# Patient Record
Sex: Female | Born: 1970
Health system: Southern US, Community
[De-identification: ages and names within clinical notes are randomized; demographics above are authoritative.]

## PROBLEM LIST (undated history)

## (undated) DIAGNOSIS — F419 Anxiety disorder, unspecified: Secondary | ICD-10-CM

## (undated) DIAGNOSIS — I471 Supraventricular tachycardia, unspecified: Secondary | ICD-10-CM

## (undated) DIAGNOSIS — K5909 Other constipation: Secondary | ICD-10-CM

## (undated) DIAGNOSIS — T7840XA Allergy, unspecified, initial encounter: Secondary | ICD-10-CM

## (undated) DIAGNOSIS — I499 Cardiac arrhythmia, unspecified: Secondary | ICD-10-CM

## (undated) DIAGNOSIS — J302 Other seasonal allergic rhinitis: Secondary | ICD-10-CM

## (undated) HISTORY — DX: Supraventricular tachycardia, unspecified: I47.10

## (undated) HISTORY — DX: Other constipation: K59.09

## (undated) HISTORY — DX: Anxiety disorder, unspecified: F41.9

## (undated) HISTORY — DX: Other seasonal allergic rhinitis: J30.2

## (undated) HISTORY — DX: Allergy, unspecified, initial encounter: T78.40XA

## (undated) HISTORY — PX: POLYPECTOMY: SHX149

## (undated) HISTORY — PX: COLONOSCOPY: SHX174

---

## 1998-06-16 ENCOUNTER — Other Ambulatory Visit: Admission: RE | Admit: 1998-06-16 | Discharge: 1998-06-16 | Payer: Self-pay | Admitting: *Deleted

## 2001-03-07 ENCOUNTER — Other Ambulatory Visit: Admission: RE | Admit: 2001-03-07 | Discharge: 2001-03-07 | Payer: Self-pay | Admitting: Obstetrics and Gynecology

## 2002-01-04 ENCOUNTER — Inpatient Hospital Stay (HOSPITAL_COMMUNITY): Admission: AD | Admit: 2002-01-04 | Discharge: 2002-01-06 | Payer: Self-pay | Admitting: Obstetrics and Gynecology

## 2002-02-12 ENCOUNTER — Other Ambulatory Visit: Admission: RE | Admit: 2002-02-12 | Discharge: 2002-02-12 | Payer: Self-pay | Admitting: Obstetrics and Gynecology

## 2003-03-06 ENCOUNTER — Other Ambulatory Visit: Admission: RE | Admit: 2003-03-06 | Discharge: 2003-03-06 | Payer: Self-pay | Admitting: Obstetrics and Gynecology

## 2004-03-13 ENCOUNTER — Other Ambulatory Visit: Admission: RE | Admit: 2004-03-13 | Discharge: 2004-03-13 | Payer: Self-pay | Admitting: Obstetrics and Gynecology

## 2004-05-27 ENCOUNTER — Ambulatory Visit: Payer: Self-pay | Admitting: Internal Medicine

## 2004-07-14 ENCOUNTER — Ambulatory Visit: Payer: Self-pay | Admitting: Family Medicine

## 2004-09-15 ENCOUNTER — Other Ambulatory Visit: Admission: RE | Admit: 2004-09-15 | Discharge: 2004-09-15 | Payer: Self-pay | Admitting: Obstetrics and Gynecology

## 2004-11-11 ENCOUNTER — Ambulatory Visit (HOSPITAL_COMMUNITY): Admission: RE | Admit: 2004-11-11 | Discharge: 2004-11-11 | Payer: Self-pay | Admitting: Obstetrics and Gynecology

## 2004-11-27 ENCOUNTER — Ambulatory Visit: Payer: Self-pay | Admitting: Cardiovascular Disease

## 2004-12-11 ENCOUNTER — Ambulatory Visit: Payer: Self-pay

## 2004-12-11 ENCOUNTER — Ambulatory Visit: Payer: Self-pay | Admitting: Cardiovascular Disease

## 2005-03-19 ENCOUNTER — Inpatient Hospital Stay (HOSPITAL_COMMUNITY): Admission: AD | Admit: 2005-03-19 | Discharge: 2005-03-19 | Payer: Self-pay | Admitting: Obstetrics and Gynecology

## 2005-03-25 ENCOUNTER — Inpatient Hospital Stay (HOSPITAL_COMMUNITY): Admission: AD | Admit: 2005-03-25 | Discharge: 2005-03-26 | Payer: Self-pay | Admitting: Obstetrics and Gynecology

## 2005-04-28 ENCOUNTER — Other Ambulatory Visit: Admission: RE | Admit: 2005-04-28 | Discharge: 2005-04-28 | Payer: Self-pay | Admitting: Obstetrics and Gynecology

## 2006-10-14 ENCOUNTER — Encounter: Admission: RE | Admit: 2006-10-14 | Discharge: 2006-10-14 | Payer: Self-pay | Admitting: Obstetrics and Gynecology

## 2006-10-18 ENCOUNTER — Encounter: Admission: RE | Admit: 2006-10-18 | Discharge: 2006-10-18 | Payer: Self-pay | Admitting: Obstetrics and Gynecology

## 2011-06-29 ENCOUNTER — Other Ambulatory Visit: Payer: Self-pay | Admitting: Obstetrics and Gynecology

## 2011-06-29 DIAGNOSIS — Z1231 Encounter for screening mammogram for malignant neoplasm of breast: Secondary | ICD-10-CM

## 2011-08-13 ENCOUNTER — Ambulatory Visit
Admission: RE | Admit: 2011-08-13 | Discharge: 2011-08-13 | Disposition: A | Discharge status: Home / Self Care | Payer: BC Managed Care – PPO | Source: Ambulatory Visit | Attending: Obstetrics and Gynecology | Admitting: Obstetrics and Gynecology

## 2011-08-13 DIAGNOSIS — Z1231 Encounter for screening mammogram for malignant neoplasm of breast: Secondary | ICD-10-CM

## 2011-08-19 ENCOUNTER — Other Ambulatory Visit: Payer: Self-pay | Admitting: Obstetrics and Gynecology

## 2011-08-19 DIAGNOSIS — R928 Other abnormal and inconclusive findings on diagnostic imaging of breast: Secondary | ICD-10-CM

## 2011-08-27 ENCOUNTER — Ambulatory Visit
Admission: RE | Admit: 2011-08-27 | Discharge: 2011-08-27 | Disposition: A | Discharge status: Home / Self Care | Payer: BC Managed Care – PPO | Source: Ambulatory Visit | Attending: Obstetrics and Gynecology | Admitting: Obstetrics and Gynecology

## 2011-08-27 DIAGNOSIS — R928 Other abnormal and inconclusive findings on diagnostic imaging of breast: Secondary | ICD-10-CM

## 2012-04-10 ENCOUNTER — Encounter: Payer: Self-pay | Admitting: Gastroenterology

## 2012-04-10 NOTE — Telephone Encounter (Signed)
Error

## 2012-05-05 ENCOUNTER — Encounter: Payer: Self-pay | Admitting: Gastroenterology

## 2012-05-05 ENCOUNTER — Ambulatory Visit (INDEPENDENT_AMBULATORY_CARE_PROVIDER_SITE_OTHER): Payer: BC Managed Care – PPO | Admitting: Gastroenterology

## 2012-05-05 VITALS — BP 98/60 | HR 72 | Ht 72.0 in | Wt 151.0 lb

## 2012-05-05 DIAGNOSIS — R198 Other specified symptoms and signs involving the digestive system and abdomen: Secondary | ICD-10-CM

## 2012-05-05 DIAGNOSIS — K5900 Constipation, unspecified: Secondary | ICD-10-CM

## 2012-05-05 DIAGNOSIS — R143 Flatulence: Secondary | ICD-10-CM

## 2012-05-05 DIAGNOSIS — R140 Abdominal distension (gaseous): Secondary | ICD-10-CM

## 2012-05-05 MED ORDER — ALIGN 4 MG PO CAPS: 1.0000 | ORAL_CAPSULE | Freq: Every day | ORAL | Status: DC

## 2012-05-05 MED ORDER — PEG-KCL-NACL-NASULF-NA ASC-C 100 G PO SOLR: 1.0000 | Freq: Once | ORAL | Status: DC

## 2012-05-05 NOTE — Patient Instructions (Addendum)
You have been scheduled for a colonoscopy with propofol. Please follow written instructions given to you at your visit today.  Please pick up your prep kit at the pharmacy within the next 1-3 days. If you use inhalers (even only as needed) or a CPAP machine, please bring them with you on the day of your procedure.  We have given you samples of Align. This puts good bacteria back into your colon. You should take 1 capsule by mouth once daily. If this works well for you, it can be purchased over the counter.  Please have Dr. Ferd Hibbs office fax your blood work and Hemoccult cards attention Marchelle Folks to [phone]. Thank you.  cc: Creola Corn, MD

## 2012-05-05 NOTE — Progress Notes (Signed)
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 I evaluated her in 8 and in 1998 with similar symptoms. She underwent flexible sigmoidoscopy in 1998 that was unremarkable except for a suboptimal bowel preparation. She was treated with laxatives at the time. She recently started following a FODMAP diet and taking MiraLax regularly and her symptoms have improved. She has blood work and stool Hemoccults pending from Dr. Ferd Hibbs office for an upcoming physical exam. She states both of her parents have had colon polyps. No family history of colon cancer or IBD. Denies weight loss, abdominal pain, diarrhea, change in stool caliber, melena, hematochezia, nausea, vomiting, dysphagia, reflux symptoms, chest pain.  Review of Systems: Pertinent positive and negative review of systems were noted in the above HPI section. All other review of systems were otherwise negative.  Current Medications, Allergies, Past Medical History, Past Surgical History, Family History and Social History were reviewed in [employer] record.  Physical Exam: General: Well developed , well nourished, no acute distress Head: Normocephalic and atraumatic Eyes:  sclerae anicteric, EOMI Ears: Normal auditory acuity Mouth: No deformity or lesions Neck: Supple, no masses or thyromegaly Lungs: Clear throughout to auscultation Heart: Regular rate and rhythm; no murmurs, rubs or bruits Abdomen: Soft, mild diffuse tenderness to deep palpation without rebound or guarding and non distended. No masses, hepatosplenomegaly or hernias noted. Normal Bowel sounds Rectal: Deferred to colonoscopy  Musculoskeletal: Symmetrical with no gross deformities  Skin: No lesions on visible extremities Pulses:  Normal pulses noted Extremities: No clubbing, cyanosis, edema  or deformities noted Neurological: Alert oriented x 4, grossly nonfocal Cervical Nodes:  No significant cervical adenopathy Inguinal Nodes: No significant inguinal adenopathy Psychological:  Alert and cooperative. Normal mood and affect  Assessment and Recommendations:  1. Change in bowel habits, constipation, gas and bloating. Await blood work and stool Hemoccults from Dr. Ferd Hibbs office. Continue MiraLax once or twice daily titrated for adequate bowel movements. Trial of a daily probiotic. Continue FODMAP diet for now but her symptoms may improve with adequate treatment of constipation. Rule out colorectal lesions. Schedule colonoscopy. The risks, benefits, and alternatives to colonoscopy with possible biopsy and possible polypectomy were discussed with the patient and they consent to proceed.

## 2012-05-08 ENCOUNTER — Encounter: Payer: Self-pay | Admitting: Gastroenterology

## 2012-05-18 ENCOUNTER — Ambulatory Visit (AMBULATORY_SURGERY_CENTER): Payer: BC Managed Care – PPO | Admitting: Gastroenterology

## 2012-05-18 ENCOUNTER — Encounter: Payer: Self-pay | Admitting: Gastroenterology

## 2012-05-18 VITALS — BP 100/65 | HR 58 | Temp 98.5°F | Resp 27 | Ht 72.0 in | Wt 151.0 lb

## 2012-05-18 DIAGNOSIS — D126 Benign neoplasm of colon, unspecified: Secondary | ICD-10-CM

## 2012-05-18 DIAGNOSIS — R141 Gas pain: Secondary | ICD-10-CM

## 2012-05-18 DIAGNOSIS — R142 Eructation: Secondary | ICD-10-CM

## 2012-05-18 DIAGNOSIS — R143 Flatulence: Secondary | ICD-10-CM

## 2012-05-18 DIAGNOSIS — K5900 Constipation, unspecified: Secondary | ICD-10-CM

## 2012-05-18 MED ORDER — SODIUM CHLORIDE 0.9 % IV SOLN: 500.0000 mL | INTRAVENOUS | Status: DC

## 2012-05-18 NOTE — Progress Notes (Signed)
Patient did not experience any of the following events: a burn prior to discharge; a fall within the facility; wrong site/side/patient/procedure/implant event; or a hospital transfer or hospital admission upon discharge from the facility. (G8907) Patient did not have preoperative order for IV antibiotic SSI prophylaxis. (G8918)  

## 2012-05-18 NOTE — Op Note (Signed)
[facility] [address] [place], [address]   COLONOSCOPY PROCEDURE REPORT  PATIENT: Monica Oconnell, Monica Oconnell  MR#: [id number] BIRTHDATE: March 04, 1971 , 41  yrs. old GENDER: Female ENDOSCOPIST: Meryl Dare, MD, [facility] PROCEDURE DATE:  05/18/2012 PROCEDURE:   Colonoscopy with snare polypectomy ASA CLASS:   Class II INDICATIONS:constipation, change in bowel habits, and abdominal pain. MEDICATIONS: MAC sedation, administered by CRNA and propofol (Diprivan) 400mg  IV DESCRIPTION OF PROCEDURE:   After the risks benefits and alternatives of the procedure were thoroughly explained, informed consent was obtained.  A digital rectal exam revealed no abnormalities of the rectum.   The LB [id number]-[id number]  endoscope was introduced through the anus and advanced to the cecum, which was identified by both the appendix and ileocecal valve. No adverse events experienced.   The quality of the prep was good, using MoviPrep  The instrument was then slowly withdrawn as the colon was fully examined.  COLON FINDINGS: A sessile polyp measuring 5 mm in size was found at the cecum.  A polypectomy was performed with a cold snare.  The resection was complete and the polyp tissue was completely retrieved.   The colon was otherwise normal.  There was no diverticulosis, inflammation, polyps or cancers unless previously stated.  Retroflexed views revealed no abnormalities. The time to cecum=3 minutes 06 seconds.  Withdrawal time=13 minutes 39 seconds. The scope was withdrawn and the procedure completed.  COMPLICATIONS: There were no complications.  ENDOSCOPIC IMPRESSION: 1.   Sessile polyp at the cecum; polypectomy performed with a cold snare 2.   The colon was otherwise normal  RECOMMENDATIONS: 1.  Await pathology results 2.  Repeat colonoscopy in 5 years if polyp adenomatous; otherwise 10 years 3.  Follow recommendations from recent office appt   eSigned:  Meryl Dare, MD, [facility]  05/18/2012 9:28 AM

## 2012-05-18 NOTE — Patient Instructions (Addendum)
Discharge instructions given with verbal understanding. Handout on polyp given. Resume previous medications. YOU HAD AN ENDOSCOPIC PROCEDURE TODAY AT THE [facility]: Refer to the procedure report that was given to you for any specific questions about what was found during the examination.  If the procedure report does not answer your questions, please call your gastroenterologist to clarify.  If you requested that your care partner not be given the details of your procedure findings, then the procedure report has been included in a sealed envelope for you to review at your convenience later.  YOU SHOULD EXPECT: Some feelings of bloating in the abdomen. Passage of more gas than usual.  Walking can help get rid of the air that was put into your GI tract during the procedure and reduce the bloating. If you had a lower endoscopy (such as a colonoscopy or flexible sigmoidoscopy) you may notice spotting of blood in your stool or on the toilet paper. If you underwent a bowel prep for your procedure, then you may not have a normal bowel movement for a few days.  DIET: Your first meal following the procedure should be a light meal and then it is ok to progress to your normal diet.  A half-sandwich or bowl of soup is an example of a good first meal.  Heavy or fried foods are harder to digest and may make you feel nauseous or bloated.  Likewise meals heavy in dairy and vegetables can cause extra gas to form and this can also increase the bloating.  Drink plenty of fluids but you should avoid alcoholic beverages for 24 hours.  ACTIVITY: Your care partner should take you home directly after the procedure.  You should plan to take it easy, moving slowly for the rest of the day.  You can resume normal activity the day after the procedure however you should NOT DRIVE or use heavy machinery for 24 hours (because of the sedation medicines used during the test).    SYMPTOMS TO REPORT IMMEDIATELY: A  gastroenterologist can be reached at any hour.  During normal business hours, 8:30 AM to 5:00 PM Monday through Friday, call [phone].  After hours and on weekends, please call the GI answering service at [phone] who will take a message and have the physician on call contact you.   Following lower endoscopy (colonoscopy or flexible sigmoidoscopy):  Excessive amounts of blood in the stool  Significant tenderness or worsening of abdominal pains  Swelling of the abdomen that is new, acute  Fever of 100F or higher   FOLLOW UP: If any biopsies were taken you will be contacted by phone or by letter within the next 1-3 weeks.  Call your gastroenterologist if you have not heard about the biopsies in 3 weeks.  Our staff will call the home number listed on your records the next business day following your procedure to check on you and address any questions or concerns that you may have at that time regarding the information given to you following your procedure. This is a courtesy call and so if there is no answer at the home number and we have not heard from you through the emergency physician on call, we will assume that you have returned to your regular daily activities without incident.  SIGNATURES/CONFIDENTIALITY: You and/or your care partner have signed paperwork which will be entered into your electronic medical record.  These signatures attest to the fact that that the information above on your After Visit Summary   has been reviewed and is understood.  Full responsibility of the confidentiality of this discharge information lies with you and/or your care-partner.  

## 2012-05-18 NOTE — Progress Notes (Signed)
Propofol per [facility] CR[facility], see scanned intra procedure report. All meds titrated per CRNA during procedure. ewm

## 2012-05-19 ENCOUNTER — Telehealth: Payer: Self-pay | Admitting: *Deleted

## 2012-05-19 NOTE — Telephone Encounter (Signed)
  Follow up Call-  Call back number 05/18/2012  Post procedure Call Back phone  # [phone]  Permission to leave phone message Yes     Patient questions:  Do you have a fever, pain , or abdominal swelling? no Pain Score  0 *  Have you tolerated food without any problems? yes  Have you been able to return to your normal activities? yes  Do you have any questions about your discharge instructions: Diet   no Medications  no Follow up visit  no  Do you have questions or concerns about your Care? no  Actions: * If pain score is 4 or above: No action needed, pain <4.

## 2012-05-23 ENCOUNTER — Encounter: Payer: Self-pay | Admitting: Gastroenterology

## 2012-06-29 ENCOUNTER — Other Ambulatory Visit: Payer: Self-pay | Admitting: Otolaryngology

## 2012-06-29 DIAGNOSIS — J343 Hypertrophy of nasal turbinates: Secondary | ICD-10-CM

## 2012-06-29 DIAGNOSIS — J329 Chronic sinusitis, unspecified: Secondary | ICD-10-CM

## 2012-07-21 ENCOUNTER — Other Ambulatory Visit: Payer: BC Managed Care – PPO

## 2012-08-08 ENCOUNTER — Other Ambulatory Visit: Payer: Self-pay | Admitting: Obstetrics and Gynecology

## 2012-08-08 DIAGNOSIS — Z1231 Encounter for screening mammogram for malignant neoplasm of breast: Secondary | ICD-10-CM

## 2012-08-14 ENCOUNTER — Telehealth: Payer: Self-pay | Admitting: Gastroenterology

## 2012-08-14 NOTE — Telephone Encounter (Signed)
Abdominal pain and bloating.  Patient has tried OTC products, prescribed meds, and dietary restrictions.  She will come in and see Willette Cluster RNP tomorrow at 2:30

## 2012-08-15 ENCOUNTER — Encounter: Payer: Self-pay | Admitting: Nurse Practitioner

## 2012-08-15 ENCOUNTER — Ambulatory Visit (INDEPENDENT_AMBULATORY_CARE_PROVIDER_SITE_OTHER): Payer: BC Managed Care – PPO | Admitting: Nurse Practitioner

## 2012-08-15 VITALS — BP 100/80 | HR 70 | Ht 72.0 in | Wt 146.6 lb

## 2012-08-15 DIAGNOSIS — R143 Flatulence: Secondary | ICD-10-CM

## 2012-08-15 DIAGNOSIS — IMO0001 Reserved for inherently not codable concepts without codable children: Secondary | ICD-10-CM

## 2012-08-15 DIAGNOSIS — K5909 Other constipation: Secondary | ICD-10-CM

## 2012-08-15 DIAGNOSIS — R140 Abdominal distension (gaseous): Secondary | ICD-10-CM

## 2012-08-15 DIAGNOSIS — K5904 Chronic idiopathic constipation: Secondary | ICD-10-CM

## 2012-08-15 MED ORDER — LINACLOTIDE 145 MCG PO CAPS: 145.0000 ug | ORAL_CAPSULE | Freq: Every day | ORAL | Status: DC

## 2012-08-15 MED ORDER — SIMETHICONE 180 MG PO CAPS: 1.0000 | ORAL_CAPSULE | Freq: Three times a day (TID) | ORAL | Status: DC

## 2012-08-15 NOTE — Patient Instructions (Signed)
You have been provided samples linzess and phazyme  Please call back if your symptoms do not improve

## 2012-08-16 ENCOUNTER — Encounter: Payer: Self-pay | Admitting: Nurse Practitioner

## 2012-08-16 DIAGNOSIS — IMO0001 Reserved for inherently not codable concepts without codable children: Secondary | ICD-10-CM | POA: Insufficient documentation

## 2012-08-16 DIAGNOSIS — R140 Abdominal distension (gaseous): Secondary | ICD-10-CM | POA: Insufficient documentation

## 2012-08-16 DIAGNOSIS — K5904 Chronic idiopathic constipation: Secondary | ICD-10-CM | POA: Insufficient documentation

## 2012-08-16 NOTE — Progress Notes (Signed)
Reviewed and agree with management plan.  Modene Andy T. Amay Mijangos, MD FACG 

## 2012-08-16 NOTE — Progress Notes (Signed)
08/16/2012 Monica Oconnell [id number] [birth year]   History of Present Illness:  Patient is a 42 year old female known to Dr. Russella Dar.She was recently evaluated by him for worsening constipation, intermittent generalized abdominal pain, gas, bloating and flatulence. She was evaluated her in 1994 and again in 1998 for similar symptoms. Following her last visit here in late October Mila underwent colonoscopy with findings of an adenomatous cecal polyp.  Patient has been following the FODMAP diet, taking a probiotic, exercising and doing everything she knows of to normalize bowel movements and decrease bloating. She doesn't really have abdominal pain, mainly just discomfort from bloating. She describes very malodorous flatus. No nausea. She has several small BMs a day and though stool isn't hard, she has difficulty expelling it. Overall she feels tired and somewhat frustrated at lack of improvement.   October 2013 labs faxed from PCP: hemosure negative B12 408 CMET normal CBC normal (hgb 13.5) U/A normal TSH normal 1.87  Labs October 2012: Endomysial antibody IgA negative.    Current Medications, Allergies, Past Medical History, Past Surgical History, Family History and Social History were reviewed in [employer] record.   Physical Exam: General: Well developed , white female in no acute distress Head: Normocephalic and atraumatic Eyes:  sclerae anicteric, conjunctiva pink  Ears: Normal auditory acuity Lungs: Clear throughout to auscultation Heart: Regular rate and rhythm Abdomen: Soft, non tender and non distended. No masses, no hepatomegaly. Normal bowel sounds Musculoskeletal: Symmetrical with no gross deformities  Extremities: No edema  Neurological: Alert oriented x 4, grossly nonfocal Psychological:  Alert and cooperative. Normal mood and affect  Assessment and Recommendations:  Pleasant 42 year old female with ongoing constipation, bloating and malodorous  flatus, all affecting her work.. Long talk with patient, reassured her that symptoms seem to be functional.   First, let's work on the constipation. I gave her samples of Linzess.   I also gave her samples of Phazyme for gas.   She will hold fiber supplements for now as they can cause gas / bloating.   If resolution of constipation doesn't improve bloating and gas then she could try a course of Xifaxan ( or even Flagyl if insurance denies Xifaxan).  Kiran is driving herself crazy thinking about what foods she can and cannot eat. I advised her to just try and avoid the obvious offenders (beans, cauliflower, etc). I gave her a low gas diet. She will call us in a few days with a condition update.

## 2012-08-30 ENCOUNTER — Ambulatory Visit: Payer: BC Managed Care – PPO | Admitting: Gastroenterology

## 2012-09-19 ENCOUNTER — Ambulatory Visit: Payer: BC Managed Care – PPO

## 2012-09-22 ENCOUNTER — Ambulatory Visit: Payer: BC Managed Care – PPO

## 2012-09-28 ENCOUNTER — Other Ambulatory Visit: Payer: Self-pay | Admitting: Nurse Practitioner

## 2012-09-28 MED ORDER — LINACLOTIDE 145 MCG PO CAPS: 145.0000 ug | ORAL_CAPSULE | Freq: Every day | ORAL | Status: DC

## 2012-10-05 ENCOUNTER — Telehealth: Payer: Self-pay | Admitting: Nurse Practitioner

## 2012-10-05 NOTE — Telephone Encounter (Signed)
Spoke with patient a few days ago regarding ongoing bloating and gas. She was having significant constipation but that has improved with Linzess. Labs for celiac were negative, she reports a normal TSH at PCP's office, her colonoscopy was nondiagnostic. She has significantly modified her diet excluding gas producing foods, artificial sweeteners, etc..Marland KitchenShe has been tried on probiotics, anti-gas medications, and a course of Xifaxan without any relief in symptoms. She has a follow up appointment tomorrow to discuss any other options.

## 2012-10-06 ENCOUNTER — Other Ambulatory Visit (INDEPENDENT_AMBULATORY_CARE_PROVIDER_SITE_OTHER): Payer: BC Managed Care – PPO

## 2012-10-06 ENCOUNTER — Ambulatory Visit (INDEPENDENT_AMBULATORY_CARE_PROVIDER_SITE_OTHER): Payer: BC Managed Care – PPO | Admitting: Gastroenterology

## 2012-10-06 ENCOUNTER — Encounter: Payer: Self-pay | Admitting: Gastroenterology

## 2012-10-06 VITALS — BP 100/60 | HR 66 | Wt 147.8 lb

## 2012-10-06 DIAGNOSIS — K5900 Constipation, unspecified: Secondary | ICD-10-CM

## 2012-10-06 NOTE — Patient Instructions (Addendum)
Your physician has requested that you go to the basement for the following lab work before leaving today:  TSH  You can try Mirilax once or twice a day.  Also, you can take Linzess 145 mcg 1-2 tablets daily. Please call our office in 2-3 weeks and let us know if this is working for you.

## 2012-10-06 NOTE — Progress Notes (Signed)
History of Present Illness: This is a 42 year old female returning for followup of constipation and gas. She was able to determine that peanut butter was leading to significant gas and flatus and since eliminating peanut butter from her diet her gas problems have abated. She is still following most of the FODMAP diet. She still has mild constipation and has several questions as to the reasons. She has fair results from [place] status but wonders if there are other alternatives.  Current Medications, Allergies, Past Medical History, Past Surgical History, Family History and Social History were reviewed in [employer] record.  Physical Exam: General: Well developed , well nourished, no acute distress Head: Normocephalic and atraumatic Eyes:  sclerae anicteric, EOMI Ears: Normal auditory acuity Mouth: No deformity or lesions Lungs: Clear throughout to auscultation Heart: Regular rate and rhythm; no murmurs, rubs or bruits Abdomen: Soft, non tender and non distended. No masses, hepatosplenomegaly or hernias noted. Normal Bowel sounds Musculoskeletal: Symmetrical with no gross deformities  Pulses:  Normal pulses noted Extremities: No clubbing, cyanosis, edema or deformities noted Neurological: Alert oriented x 4, grossly nonfocal Psychological:  Alert and cooperative. Normal mood and affect  Assessment and Recommendations:  1. Constipation. Trial of Linzess 290 mcg daily and a repeat trial of MiraLax once or twice daily titrated to need. Initially she felt MiraLax was leading to increased intestinal gas but since her gas problems have resolved it is worth retrying MiraLax. I've asked to call our office to report her preferred regimen for long-term management constipation. If her symptoms are stable she can return for followup in one year and as needed.

## 2012-10-17 ENCOUNTER — Telehealth: Payer: Self-pay | Admitting: Nurse Practitioner

## 2012-10-17 NOTE — Telephone Encounter (Signed)
Patient call my cell over weekend. She is still constipated despite 2 daily Linzess and addition of Miralax. I called in moviprep for a bowel purge. Following that she will try amitiza bid instead of Linzess. Amitiza also called to pharmacy. Patient will call me in a few days with update. If she is still constipated then consider sitz marker study to determine if this is colonic inertia or more of outlet problem. She may eventually require referral to motility clinic.

## 2012-11-10 ENCOUNTER — Ambulatory Visit
Admission: RE | Admit: 2012-11-10 | Discharge: 2012-11-10 | Disposition: A | Discharge status: Home / Self Care | Payer: BC Managed Care – PPO | Source: Ambulatory Visit | Attending: Obstetrics and Gynecology | Admitting: Obstetrics and Gynecology

## 2012-11-10 DIAGNOSIS — Z1231 Encounter for screening mammogram for malignant neoplasm of breast: Secondary | ICD-10-CM

## 2013-10-24 ENCOUNTER — Other Ambulatory Visit: Payer: Self-pay

## 2013-10-24 DIAGNOSIS — Z1231 Encounter for screening mammogram for malignant neoplasm of breast: Secondary | ICD-10-CM

## 2013-11-16 ENCOUNTER — Ambulatory Visit
Admission: RE | Admit: 2013-11-16 | Discharge: 2013-11-16 | Disposition: A | Discharge status: Home / Self Care | Payer: BC Managed Care – PPO | Source: Ambulatory Visit

## 2013-11-16 ENCOUNTER — Encounter (INDEPENDENT_AMBULATORY_CARE_PROVIDER_SITE_OTHER): Payer: Self-pay

## 2013-11-16 DIAGNOSIS — Z1231 Encounter for screening mammogram for malignant neoplasm of breast: Secondary | ICD-10-CM

## 2014-11-08 ENCOUNTER — Other Ambulatory Visit: Payer: Self-pay

## 2014-11-08 DIAGNOSIS — Z1231 Encounter for screening mammogram for malignant neoplasm of breast: Secondary | ICD-10-CM

## 2014-11-22 ENCOUNTER — Ambulatory Visit
Admission: RE | Admit: 2014-11-22 | Discharge: 2014-11-22 | Disposition: A | Discharge status: Home / Self Care | Payer: BLUE CROSS/BLUE SHIELD | Source: Ambulatory Visit

## 2014-11-22 DIAGNOSIS — Z1231 Encounter for screening mammogram for malignant neoplasm of breast: Secondary | ICD-10-CM

## 2014-12-19 ENCOUNTER — Other Ambulatory Visit: Payer: Self-pay

## 2014-12-20 LAB — CYTOLOGY - PAP

## 2015-09-29 ENCOUNTER — Other Ambulatory Visit: Payer: Self-pay

## 2015-09-29 DIAGNOSIS — Z1231 Encounter for screening mammogram for malignant neoplasm of breast: Secondary | ICD-10-CM

## 2015-12-05 ENCOUNTER — Ambulatory Visit
Admission: RE | Admit: 2015-12-05 | Discharge: 2015-12-05 | Disposition: A | Discharge status: Home / Self Care | Payer: Commercial Managed Care - HMO | Source: Ambulatory Visit

## 2015-12-05 DIAGNOSIS — Z1231 Encounter for screening mammogram for malignant neoplasm of breast: Secondary | ICD-10-CM

## 2016-04-11 HISTORY — PX: SEPTOPLASTY: SHX2393

## 2016-10-06 ENCOUNTER — Other Ambulatory Visit: Payer: Self-pay | Admitting: Internal Medicine

## 2016-10-06 DIAGNOSIS — Z1231 Encounter for screening mammogram for malignant neoplasm of breast: Secondary | ICD-10-CM

## 2016-11-19 ENCOUNTER — Other Ambulatory Visit: Payer: Self-pay | Admitting: Internal Medicine

## 2016-11-19 DIAGNOSIS — N644 Mastodynia: Secondary | ICD-10-CM

## 2016-11-26 ENCOUNTER — Ambulatory Visit
Admission: RE | Admit: 2016-11-26 | Discharge: 2016-11-26 | Disposition: A | Discharge status: Home / Self Care | Payer: 59 | Source: Ambulatory Visit | Attending: Internal Medicine | Admitting: Internal Medicine

## 2016-11-26 DIAGNOSIS — N644 Mastodynia: Secondary | ICD-10-CM

## 2016-12-10 ENCOUNTER — Ambulatory Visit
Admission: RE | Admit: 2016-12-10 | Discharge: 2016-12-10 | Disposition: A | Discharge status: Home / Self Care | Payer: 59 | Source: Ambulatory Visit | Attending: Internal Medicine | Admitting: Internal Medicine

## 2016-12-10 DIAGNOSIS — Z1231 Encounter for screening mammogram for malignant neoplasm of breast: Secondary | ICD-10-CM

## 2017-03-07 DIAGNOSIS — Z01419 Encounter for gynecological examination (general) (routine) without abnormal findings: Secondary | ICD-10-CM | POA: Diagnosis not present

## 2017-03-07 DIAGNOSIS — Z124 Encounter for screening for malignant neoplasm of cervix: Secondary | ICD-10-CM | POA: Diagnosis not present

## 2017-05-23 ENCOUNTER — Encounter: Payer: Self-pay | Admitting: Gastroenterology

## 2017-07-26 ENCOUNTER — Ambulatory Visit (AMBULATORY_SURGERY_CENTER): Payer: Self-pay | Admitting: *Deleted

## 2017-07-26 ENCOUNTER — Other Ambulatory Visit: Payer: Self-pay

## 2017-07-26 VITALS — Ht 71.0 in | Wt 147.0 lb

## 2017-07-26 DIAGNOSIS — H04123 Dry eye syndrome of bilateral lacrimal glands: Secondary | ICD-10-CM | POA: Diagnosis not present

## 2017-07-26 DIAGNOSIS — Z86010 Personal history of colonic polyps: Secondary | ICD-10-CM

## 2017-07-26 DIAGNOSIS — H11153 Pinguecula, bilateral: Secondary | ICD-10-CM | POA: Diagnosis not present

## 2017-07-26 MED ORDER — NA SULFATE-K SULFATE-MG SULF 17.5-3.13-1.6 GM/177ML PO SOLN: 1.0000 | Freq: Once | ORAL | 0 refills | Status: AC

## 2017-07-26 NOTE — Progress Notes (Signed)
No egg or soy allergy known to patient  No issues with past sedation with any surgeries  or procedures, no intubation problems  No diet pills per patient No home 02 use per patient  No blood thinners per patient  Pt denies issues with constipation  No A fib or A flutter  EMMI video sent to pt's e mail - pt declined  $15 on line coupon to pt for suprep --- pt made aware of possible co pay for suprep

## 2017-07-27 ENCOUNTER — Encounter: Payer: Self-pay | Admitting: Gastroenterology

## 2017-08-09 ENCOUNTER — Other Ambulatory Visit: Payer: Self-pay

## 2017-08-09 ENCOUNTER — Ambulatory Visit (AMBULATORY_SURGERY_CENTER): Payer: 59 | Admitting: Gastroenterology

## 2017-08-09 ENCOUNTER — Encounter: Payer: Self-pay | Admitting: Gastroenterology

## 2017-08-09 VITALS — BP 97/65 | HR 60 | Temp 98.0°F | Resp 12 | Ht 71.0 in | Wt 147.0 lb

## 2017-08-09 DIAGNOSIS — Z86010 Personal history of colonic polyps: Secondary | ICD-10-CM

## 2017-08-09 DIAGNOSIS — D120 Benign neoplasm of cecum: Secondary | ICD-10-CM | POA: Diagnosis not present

## 2017-08-09 DIAGNOSIS — K635 Polyp of colon: Secondary | ICD-10-CM | POA: Diagnosis not present

## 2017-08-09 MED ORDER — SODIUM CHLORIDE 0.9 % IV SOLN: 500.0000 mL | Freq: Once | INTRAVENOUS | Status: DC

## 2017-08-09 NOTE — Progress Notes (Signed)
Report to PACU, RN, vss, BBS= Clear.  

## 2017-08-09 NOTE — Progress Notes (Signed)
Pt's states no medical or surgical changes since previsit or office visit. 

## 2017-08-09 NOTE — Op Note (Signed)
[place] Patient Name: Monica Oconnell Procedure Date: 08/09/2017 7:27 AM MRN: [id number] Endoscopist: Ladene Artist , MD Age: 47 Referring MD:  Date of Birth: 01-Dec-1970 Gender: Female Account #: [id number] Procedure:                Colonoscopy Indications:              Surveillance: Personal history of adenomatous                            polyps on last colonoscopy 5 years ago Medicines:                Monitored Anesthesia Care Procedure:                Pre-Anesthesia Assessment:                           - Prior to the procedure, a History and Physical                            was performed, and patient medications and                            allergies were reviewed. The patient's tolerance of                            previous anesthesia was also reviewed. The risks                            and benefits of the procedure and the sedation                            options and risks were discussed with the patient.                            All questions were answered, and informed consent                            was obtained. Prior Anticoagulants: The patient has                            taken no previous anticoagulant or antiplatelet                            agents. ASA Grade Assessment: II - A patient with                            mild systemic disease. After reviewing the risks                            and benefits, the patient was deemed in                            satisfactory condition to undergo the procedure.  After obtaining informed consent, the colonoscope                            was passed under direct vision. Throughout the                            procedure, the patient's blood pressure, pulse, and                            oxygen saturations were monitored continuously. The                            Colonoscope was introduced through the anus and                            advanced to the the cecum,  identified by                            appendiceal orifice and ileocecal valve. The                            ileocecal valve, appendiceal orifice, and rectum                            were photographed. The quality of the bowel                            preparation was good. The colonoscopy was performed                            without difficulty. The patient tolerated the                            procedure well. Scope In: 8:10:49 AM Scope Out: 8:29:57 AM Scope Withdrawal Time: 0 hours 14 minutes 39 seconds  Total Procedure Duration: 0 hours 19 minutes 8 seconds  Findings:                 The perianal and digital rectal examinations were                            normal.                           Three sessile polyps were found in the cecum. The                            polyps were 5 to 8 mm in size. These polyps were                            removed with a cold snare. Resection and retrieval                            were complete.  The exam was otherwise without abnormality on                            direct and retroflexion views. Complications:            No immediate complications. Estimated blood loss:                            None. Estimated Blood Loss:     Estimated blood loss: none. Impression:               - Three 5 to 8 mm polyps in the cecum, removed with                            a cold snare. Resected and retrieved.                           - The examination was otherwise normal on direct                            and retroflexion views. Recommendation:           - Repeat colonoscopy in 3 - 5 years for                            surveillance pending pathology review.                           - Patient has a contact number available for                            emergencies. The signs and symptoms of potential                            delayed complications were discussed with the                            patient.  Return to normal activities tomorrow.                            Written discharge instructions were provided to the                            patient.                           - Resume previous diet.                           - Continue present medications.                           - Await pathology results. Ladene Artist, MD 08/09/2017 8:33:04 AM This report has been signed electronically.

## 2017-08-09 NOTE — Patient Instructions (Signed)
YOU HAD AN ENDOSCOPIC PROCEDURE TODAY AT THE [facility]:   Refer to the procedure report that was given to you for any specific questions about what was found during the examination.  If the procedure report does not answer your questions, please call your gastroenterologist to clarify.  If you requested that your care partner not be given the details of your procedure findings, then the procedure report has been included in a sealed envelope for you to review at your convenience later.  YOU SHOULD EXPECT: Some feelings of bloating in the abdomen. Passage of more gas than usual.  Walking can help get rid of the air that was put into your GI tract during the procedure and reduce the bloating. If you had a lower endoscopy (such as a colonoscopy or flexible sigmoidoscopy) you may notice spotting of blood in your stool or on the toilet paper. If you underwent a bowel prep for your procedure, you may not have a normal bowel movement for a few days.  Please Note:  You might notice some irritation and congestion in your nose or some drainage.  This is from the oxygen used during your procedure.  There is no need for concern and it should clear up in a day or so.  SYMPTOMS TO REPORT IMMEDIATELY:   Following lower endoscopy (colonoscopy or flexible sigmoidoscopy):  Excessive amounts of blood in the stool  Significant tenderness or worsening of abdominal pains  Swelling of the abdomen that is new, acute  Fever of 100F or higher  For urgent or emergent issues, a gastroenterologist can be reached at any hour by calling [phone].   DIET:  We do recommend a small meal at first, but then you may proceed to your regular diet.  Drink plenty of fluids but you should avoid alcoholic beverages for 24 hours.  ACTIVITY:  You should plan to take it easy for the rest of today and you should NOT DRIVE or use heavy machinery until tomorrow (because of the sedation medicines used during the test).     FOLLOW UP: Our staff will call the number listed on your records the next business day following your procedure to check on you and address any questions or concerns that you may have regarding the information given to you following your procedure. If we do not reach you, we will leave a message.  However, if you are feeling well and you are not experiencing any problems, there is no need to return our call.  We will assume that you have returned to your regular daily activities without incident.  If any biopsies were taken you will be contacted by phone or by letter within the next 1-3 weeks.  Please call us at [phone] if you have not heard about the biopsies in 3 weeks.   Await for biopsy results to determine next repeat Colonoscopy screening Polyps (handout given)  SIGNATURES/CONFIDENTIALITY: You and/or your care partner have signed paperwork which will be entered into your electronic medical record.  These signatures attest to the fact that that the information above on your After Visit Summary has been reviewed and is understood.  Full responsibility of the confidentiality of this discharge information lies with you and/or your care-partner. 

## 2017-08-09 NOTE — Progress Notes (Signed)
Called to room to assist during endoscopic procedure.  Patient ID and intended procedure confirmed with present staff. Received instructions for my participation in the procedure from the performing physician.  

## 2017-08-10 ENCOUNTER — Telehealth: Payer: Self-pay | Admitting: *Deleted

## 2017-08-10 NOTE — Telephone Encounter (Signed)
  Follow up Call-  Call back number 08/09/2017  Post procedure Call Back phone  # [phone]  Permission to leave phone message Yes  Some recent data might be hidden     Patient questions:  Do you have a fever, pain , or abdominal swelling? No. Pain Score  0 *  Have you tolerated food without any problems? Yes.    Have you been able to return to your normal activities? Yes.    Do you have any questions about your discharge instructions: Diet   No. Medications  No. Follow up visit  No.  Do you have questions or concerns about your Care? No.  Actions: * If pain score is 4 or above: No action needed, pain <4.

## 2017-08-10 NOTE — Telephone Encounter (Signed)
No answer, left message to call if questions or concerns. 

## 2017-08-25 ENCOUNTER — Encounter: Payer: Self-pay | Admitting: Gastroenterology

## 2017-10-12 DIAGNOSIS — J3089 Other allergic rhinitis: Secondary | ICD-10-CM | POA: Diagnosis not present

## 2017-10-12 DIAGNOSIS — R82998 Other abnormal findings in urine: Secondary | ICD-10-CM | POA: Diagnosis not present

## 2017-10-12 DIAGNOSIS — R05 Cough: Secondary | ICD-10-CM | POA: Diagnosis not present

## 2017-10-12 DIAGNOSIS — Z0000 Encounter for general adult medical examination without abnormal findings: Secondary | ICD-10-CM | POA: Diagnosis not present

## 2017-10-12 DIAGNOSIS — J3081 Allergic rhinitis due to animal (cat) (dog) hair and dander: Secondary | ICD-10-CM | POA: Diagnosis not present

## 2017-10-12 DIAGNOSIS — E538 Deficiency of other specified B group vitamins: Secondary | ICD-10-CM | POA: Diagnosis not present

## 2017-10-18 DIAGNOSIS — Z0000 Encounter for general adult medical examination without abnormal findings: Secondary | ICD-10-CM | POA: Diagnosis not present

## 2017-10-18 DIAGNOSIS — N644 Mastodynia: Secondary | ICD-10-CM | POA: Diagnosis not present

## 2017-10-18 DIAGNOSIS — E559 Vitamin D deficiency, unspecified: Secondary | ICD-10-CM | POA: Diagnosis not present

## 2017-10-18 DIAGNOSIS — E538 Deficiency of other specified B group vitamins: Secondary | ICD-10-CM | POA: Diagnosis not present

## 2017-10-18 DIAGNOSIS — Z1389 Encounter for screening for other disorder: Secondary | ICD-10-CM | POA: Diagnosis not present

## 2018-01-23 IMAGING — MG 2D DIGITAL SCREENING BILATERAL MAMMOGRAM WITH CAD AND ADJUNCT TO
9 of 12 series · 9 of 28 positions shown · non-contrast
Comparison: Previous exam(s).

CLINICAL DATA: Screening.

EXAM:
2D DIGITAL SCREENING BILATERAL MAMMOGRAM WITH CAD AND ADJUNCT TOMO

[R MLO synth-2D]
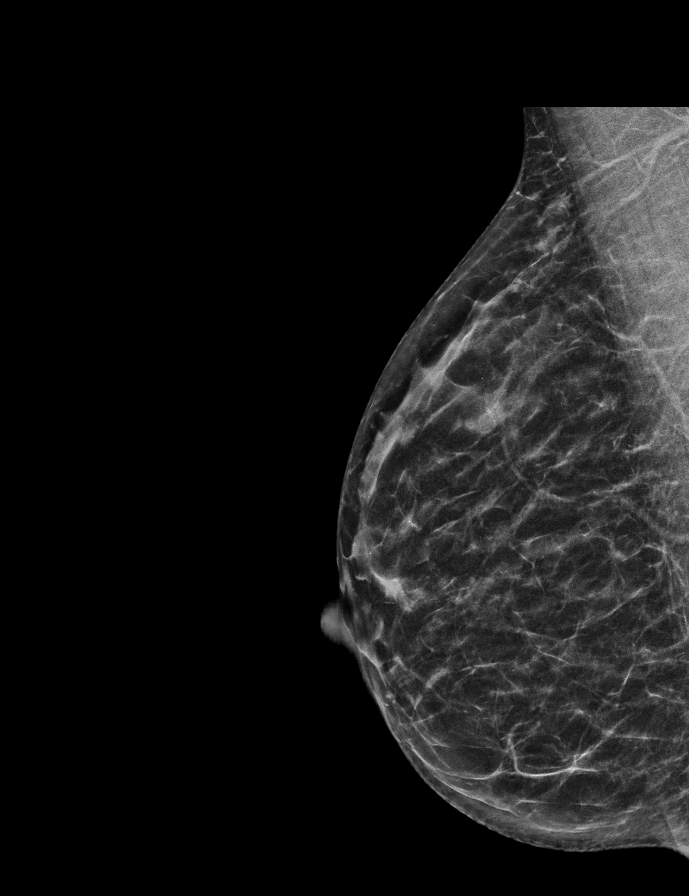

[L MLO synth-2D]
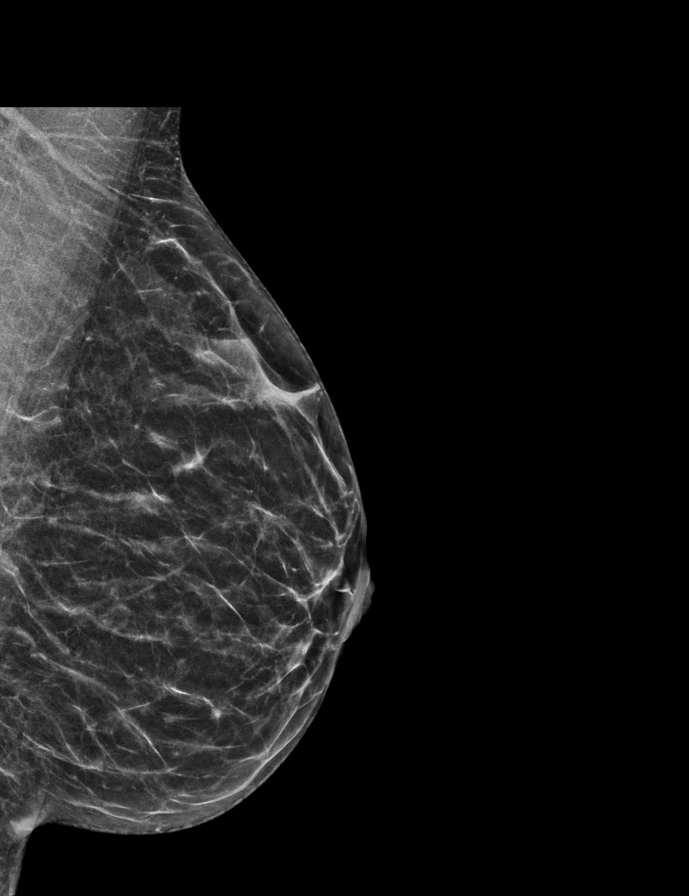

[L CC]
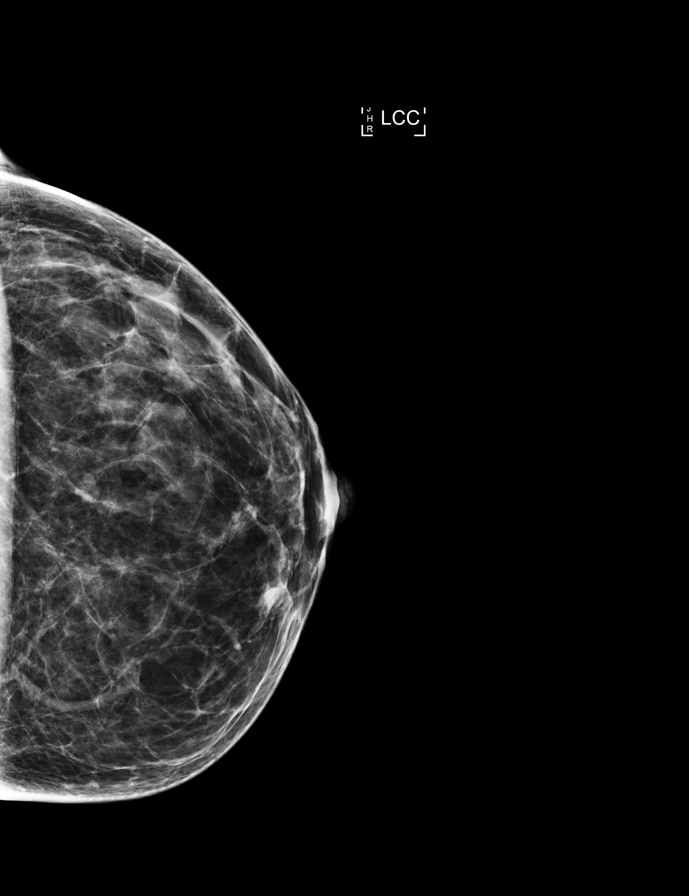

[R MLO]
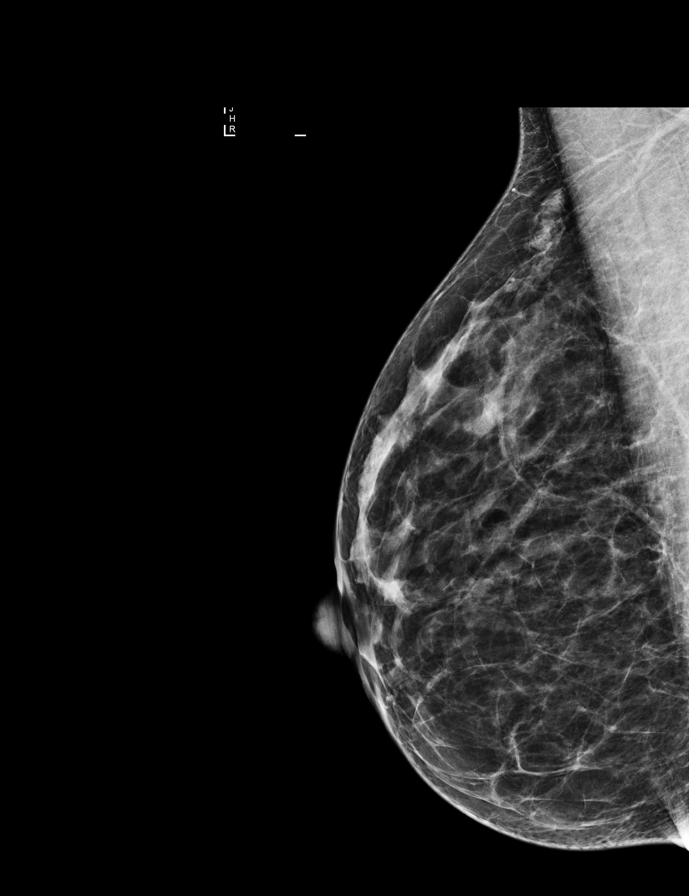

[R CC synth-2D]
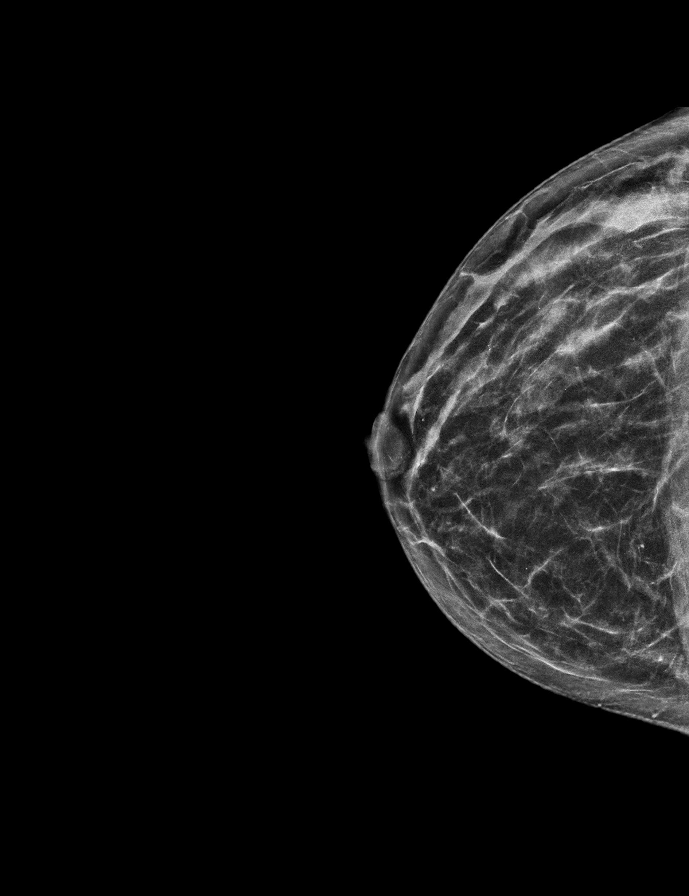

[L CC synth-2D]
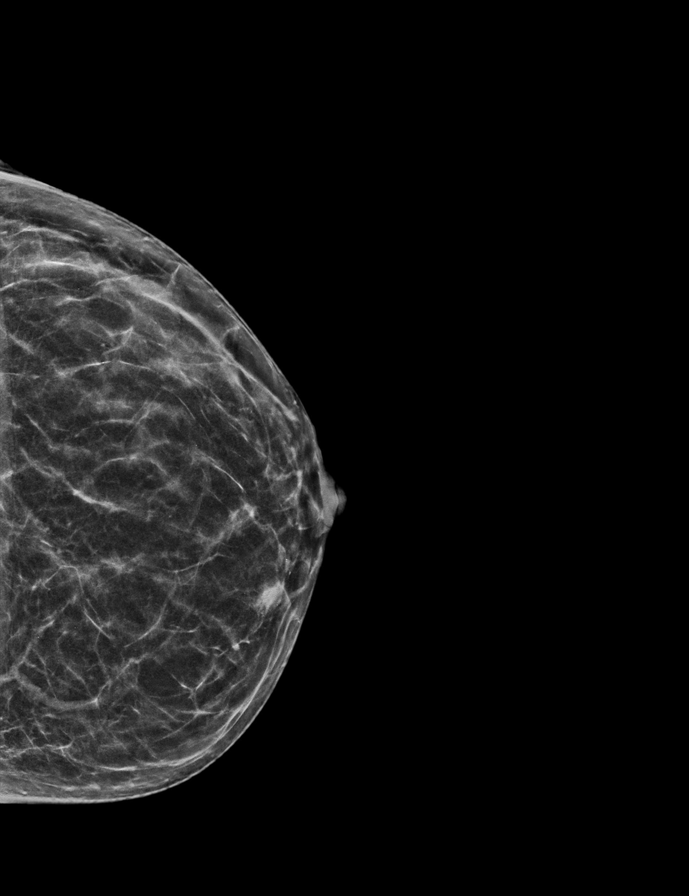

[L MLO]
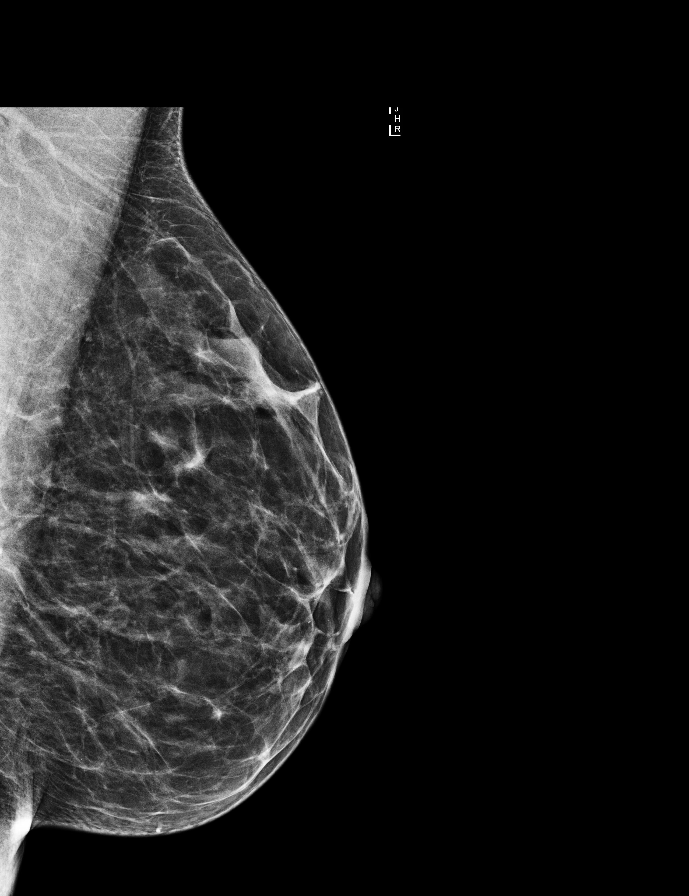

[R CC]
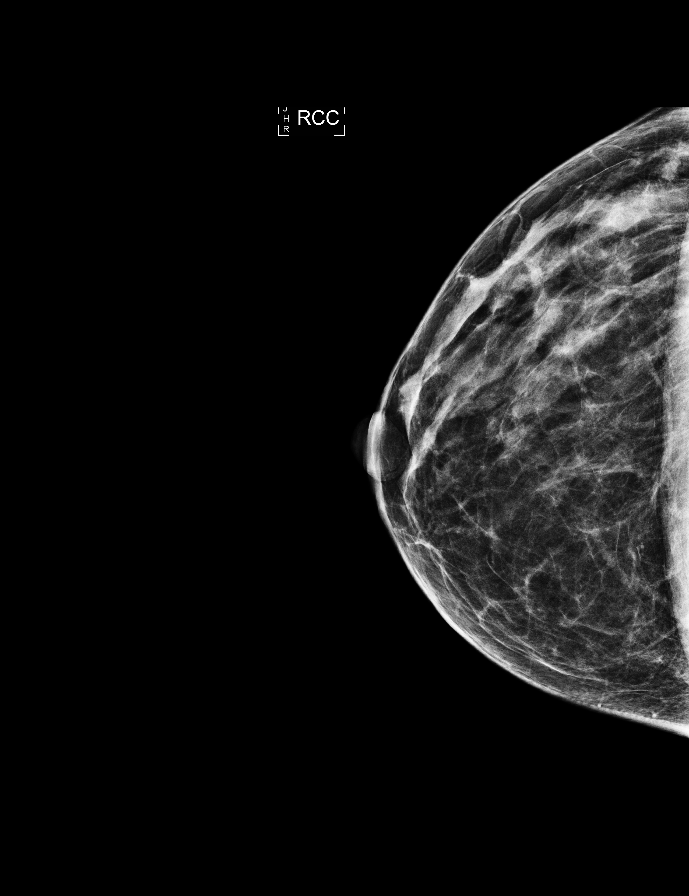

[L MLO tomo · tomo slice 23/46.0]
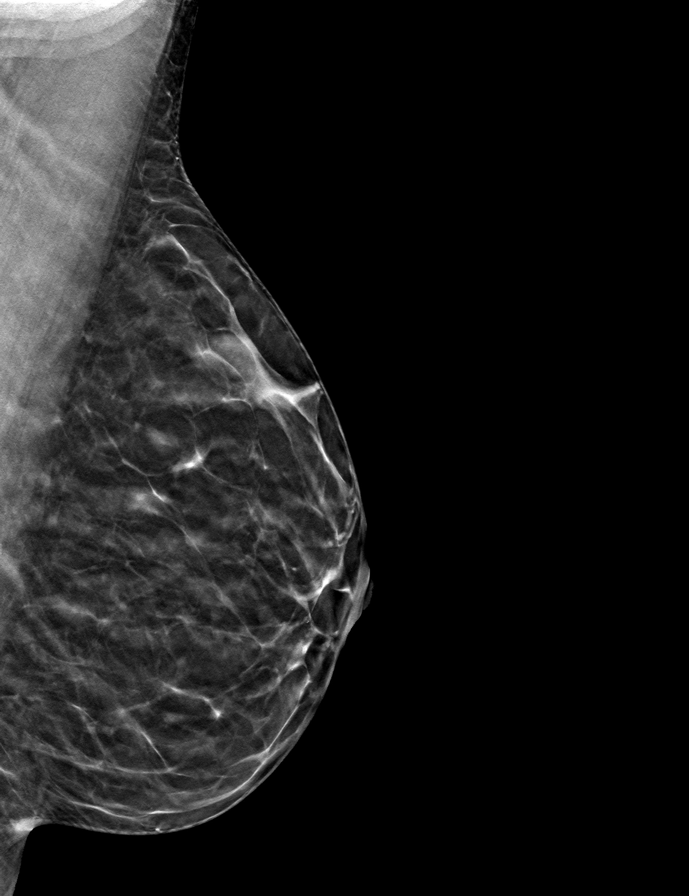

[9 of 28 positions shown; findings below may reference images not displayed]

ACR Breast Density Category b: There are scattered areas of
fibroglandular density.
FINDINGS: There are no findings suspicious for malignancy. Images were
processed with CAD.
IMPRESSION: No mammographic evidence of malignancy. A result letter of this
screening mammogram will be mailed directly to the patient.

RECOMMENDATION:
Screening mammogram in one year. (Code:97-6-RS4)

BI-RADS CATEGORY  1: Negative.

## 2018-04-21 DIAGNOSIS — Z124 Encounter for screening for malignant neoplasm of cervix: Secondary | ICD-10-CM | POA: Diagnosis not present

## 2018-04-21 DIAGNOSIS — Z681 Body mass index (BMI) 19 or less, adult: Secondary | ICD-10-CM | POA: Diagnosis not present

## 2018-04-21 DIAGNOSIS — Z01419 Encounter for gynecological examination (general) (routine) without abnormal findings: Secondary | ICD-10-CM | POA: Diagnosis not present

## 2018-04-21 DIAGNOSIS — Z1231 Encounter for screening mammogram for malignant neoplasm of breast: Secondary | ICD-10-CM | POA: Diagnosis not present

## 2018-04-21 DIAGNOSIS — Z130 Encounter for screening for diseases of the blood and blood-forming organs and certain disorders involving the immune mechanism: Secondary | ICD-10-CM | POA: Diagnosis not present

## 2018-05-04 DIAGNOSIS — M9902 Segmental and somatic dysfunction of thoracic region: Secondary | ICD-10-CM | POA: Diagnosis not present

## 2018-05-04 DIAGNOSIS — M9901 Segmental and somatic dysfunction of cervical region: Secondary | ICD-10-CM | POA: Diagnosis not present

## 2018-05-04 DIAGNOSIS — M9904 Segmental and somatic dysfunction of sacral region: Secondary | ICD-10-CM | POA: Diagnosis not present

## 2018-05-09 DIAGNOSIS — M9904 Segmental and somatic dysfunction of sacral region: Secondary | ICD-10-CM | POA: Diagnosis not present

## 2018-05-09 DIAGNOSIS — M9902 Segmental and somatic dysfunction of thoracic region: Secondary | ICD-10-CM | POA: Diagnosis not present

## 2018-05-09 DIAGNOSIS — M9901 Segmental and somatic dysfunction of cervical region: Secondary | ICD-10-CM | POA: Diagnosis not present

## 2018-08-04 DIAGNOSIS — D225 Melanocytic nevi of trunk: Secondary | ICD-10-CM | POA: Diagnosis not present

## 2018-08-04 DIAGNOSIS — D224 Melanocytic nevi of scalp and neck: Secondary | ICD-10-CM | POA: Diagnosis not present

## 2018-08-04 DIAGNOSIS — D2261 Melanocytic nevi of right upper limb, including shoulder: Secondary | ICD-10-CM | POA: Diagnosis not present

## 2018-08-08 DIAGNOSIS — H11153 Pinguecula, bilateral: Secondary | ICD-10-CM | POA: Diagnosis not present

## 2018-08-08 DIAGNOSIS — H04123 Dry eye syndrome of bilateral lacrimal glands: Secondary | ICD-10-CM | POA: Diagnosis not present

## 2018-08-08 DIAGNOSIS — H2513 Age-related nuclear cataract, bilateral: Secondary | ICD-10-CM | POA: Diagnosis not present

## 2018-11-10 DIAGNOSIS — Z0000 Encounter for general adult medical examination without abnormal findings: Secondary | ICD-10-CM | POA: Diagnosis not present

## 2018-11-15 DIAGNOSIS — R82998 Other abnormal findings in urine: Secondary | ICD-10-CM | POA: Diagnosis not present

## 2018-11-17 DIAGNOSIS — Z0000 Encounter for general adult medical examination without abnormal findings: Secondary | ICD-10-CM | POA: Diagnosis not present

## 2019-07-22 ENCOUNTER — Ambulatory Visit: Payer: 59 | Attending: Internal Medicine

## 2019-07-25 ENCOUNTER — Ambulatory Visit: Payer: 59 | Admitting: Podiatry

## 2020-04-29 ENCOUNTER — Telehealth (HOSPITAL_COMMUNITY): Payer: Self-pay

## 2020-04-29 NOTE — Telephone Encounter (Signed)
Received referral from Dr. Virgina Jock and called to discuss with patient about Covid symptoms and the use of the monoclonal antibody infusion for those with mild to moderate Covid symptoms and at a high risk of hospitalization.     Pt does not appear to qualify for this infusion. Asked Worthy Keeler, NP to review chart for any additional qualifying risk factors that may meet criteria, but unfortunately no risk factors found. Will call & inform patient that she does not qualify.

## 2020-06-09 ENCOUNTER — Encounter: Payer: Self-pay | Admitting: Gastroenterology

## 2020-07-25 ENCOUNTER — Ambulatory Visit (AMBULATORY_SURGERY_CENTER): Payer: Self-pay | Admitting: *Deleted

## 2020-07-25 ENCOUNTER — Other Ambulatory Visit: Payer: Self-pay

## 2020-07-25 VITALS — Ht 72.0 in | Wt 148.0 lb

## 2020-07-25 DIAGNOSIS — Z86010 Personal history of colonic polyps: Secondary | ICD-10-CM

## 2020-07-25 MED ORDER — PLENVU 140 G PO SOLR: 1.0000 | Freq: Once | ORAL | 0 refills | Status: AC

## 2020-07-25 NOTE — Progress Notes (Addendum)
No egg or soy allergy known to patient  No issues with past sedation with any surgeries or procedures No intubation problems in the past  No FH of Malignant Hyperthermia No diet pills per patient No home 02 use per patient  No blood thinners per patient  Pt denies issues with constipation  No A fib or A flutter  EMMI video to pt or via [place] 19 guidelines implemented in PV today with Pt and RN  Pt is fully vaccinated  for [employer] completed. Instructions sent with [employer] coupon.    Due to the COVID-19 pandemic we are asking patients to follow certain guidelines.  Pt aware of COVID protocols and [facility] guidelines

## 2020-07-28 ENCOUNTER — Encounter: Payer: Self-pay | Admitting: Gastroenterology

## 2020-08-12 ENCOUNTER — Ambulatory Visit (AMBULATORY_SURGERY_CENTER): Payer: 59 | Admitting: Gastroenterology

## 2020-08-12 ENCOUNTER — Other Ambulatory Visit: Payer: Self-pay

## 2020-08-12 ENCOUNTER — Encounter: Payer: Self-pay | Admitting: Gastroenterology

## 2020-08-12 VITALS — BP 102/72 | HR 65 | Temp 97.3°F | Resp 18 | Ht 72.0 in | Wt 148.0 lb

## 2020-08-12 DIAGNOSIS — Z86010 Personal history of colonic polyps: Secondary | ICD-10-CM

## 2020-08-12 MED ORDER — SODIUM CHLORIDE 0.9 % IV SOLN
500.0000 mL | Freq: Once | INTRAVENOUS | Status: DC
Start: 2020-08-12 — End: 2020-08-12

## 2020-08-12 NOTE — Progress Notes (Signed)
Pt's states no medical or surgical changes since previsit or office visit. 

## 2020-08-12 NOTE — Progress Notes (Signed)
C.W. vital signs. 

## 2020-08-12 NOTE — Op Note (Signed)
[facility] Patient Name: Monica Oconnell Procedure Date: 08/12/2020 12:46 PM MRN: [id number] Endoscopist: Meryl Dare , MD Age: 50 Referring MD:  Date of Birth: 1971-06-28 Gender: Female Account #: [id number] Procedure:                Colonoscopy Indications:              High risk colon cancer surveillance: Personal                            history of sessile serrated colon polyp (less than                            10 mm in size) with no dysplasia Medicines:                Monitored Anesthesia Care Procedure:                Pre-Anesthesia Assessment:                           - Prior to the procedure, a History and Physical                            was performed, and patient medications and                            allergies were reviewed. The patient's tolerance of                            previous anesthesia was also reviewed. The risks                            and benefits of the procedure and the sedation                            options and risks were discussed with the patient.                            All questions were answered, and informed consent                            was obtained. Prior Anticoagulants: The patient has                            taken no previous anticoagulant or antiplatelet                            agents. ASA Grade Assessment: II - A patient with                            mild systemic disease. After reviewing the risks                            and benefits, the patient was deemed in  satisfactory condition to undergo the procedure.                           After obtaining informed consent, the colonoscope                            was passed under direct vision. Throughout the                            procedure, the patient's blood pressure, pulse, and                            oxygen saturations were monitored continuously. The                            Olympus CF-HQ190L (Serial#  [id number]) Colonoscope was                            introduced through the anus and advanced to the the                            cecum, identified by appendiceal orifice and                            ileocecal valve. The ileocecal valve, appendiceal                            orifice, and rectum were photographed. The quality                            of the bowel preparation was excellent. The                            colonoscopy was performed without difficulty. The                            patient tolerated the procedure well. Scope In: 1:44:57 PM Scope Out: 2:06:22 PM Scope Withdrawal Time: 0 hours 14 minutes 14 seconds  Total Procedure Duration: 0 hours 21 minutes 25 seconds  Findings:                 The perianal and digital rectal examinations were                            normal.                           The entire examined colon appeared normal on direct                            and retroflexion views. Complications:            No immediate complications. Estimated blood loss:                            None. Estimated Blood  Loss:     Estimated blood loss: none. Impression:               - The entire examined colon is normal on direct and                            retroflexion views.                           - No specimens collected. Recommendation:           - Repeat colonoscopy in 7 years for surveillance.                           - Patient has a contact number available for                            emergencies. The signs and symptoms of potential                            delayed complications were discussed with the                            patient. Return to normal activities tomorrow.                            Written discharge instructions were provided to the                            patient.                           - Resume previous diet.                           - Continue present medications. Ladene Artist, MD 08/12/2020 2:18:22 PM This  report has been signed electronically.

## 2020-08-12 NOTE — Progress Notes (Signed)
A and O x3. Report to RN. Tolerated MAC anesthesia well.

## 2020-08-12 NOTE — Patient Instructions (Signed)
Repeat colonoscopy in 7 years for surveillance. °YOU HAD AN ENDOSCOPIC PROCEDURE TODAY AT THE [facility]:   Refer to the procedure report that was given to you for any specific questions about what was found during the examination.  If the procedure report does not answer your questions, please call your gastroenterologist to clarify.  If you requested that your care partner not be given the details of your procedure findings, then the procedure report has been included in a sealed envelope for you to review at your convenience later. ° °YOU SHOULD EXPECT: Some feelings of bloating in the abdomen. Passage of more gas than usual.  Walking can help get rid of the air that was put into your GI tract during the procedure and reduce the bloating. If you had a lower endoscopy (such as a colonoscopy or flexible sigmoidoscopy) you may notice spotting of blood in your stool or on the toilet paper. If you underwent a bowel prep for your procedure, you may not have a normal bowel movement for a few days. ° °Please Note:  You might notice some irritation and congestion in your nose or some drainage.  This is from the oxygen used during your procedure.  There is no need for concern and it should clear up in a day or so. ° °SYMPTOMS TO REPORT IMMEDIATELY: ° °Following lower endoscopy (colonoscopy or flexible sigmoidoscopy): ° Excessive amounts of blood in the stool ° Significant tenderness or worsening of abdominal pains ° Swelling of the abdomen that is new, acute ° Fever of 100°F or higher ° °For urgent or emergent issues, a gastroenterologist can be reached at any hour by calling [phone]. °Do not use MyChart messaging for urgent concerns.  ° ° °DIET:  We do recommend a small meal at first, but then you may proceed to your regular diet.  Drink plenty of fluids but you should avoid alcoholic beverages for 24 hours. ° °ACTIVITY:  You should plan to take it easy for the rest of today and you should NOT DRIVE  or use heavy machinery until tomorrow (because of the sedation medicines used during the test).   ° °FOLLOW UP: °Our staff will call the number listed on your records 48-72 hours following your procedure to check on you and address any questions or concerns that you may have regarding the information given to you following your procedure. If we do not reach you, we will leave a message.  We will attempt to reach you two times.  During this call, we will ask if you have developed any symptoms of COVID 19. If you develop any symptoms (ie: fever, flu-like symptoms, shortness of breath, cough etc.) before then, please call [phone].  If you test positive for Covid 19 in the 2 weeks post procedure, please call and report this information to us.   ° °If any biopsies were taken you will be contacted by phone or by letter within the next 1-3 weeks.  Please call us at [phone] if you have not heard about the biopsies in 3 weeks.  ° ° °SIGNATURES/CONFIDENTIALITY: °You and/or your care partner have signed paperwork which will be entered into your electronic medical record.  These signatures attest to the fact that that the information above on your After Visit Summary has been reviewed and is understood.  Full responsibility of the confidentiality of this discharge information lies with you and/or your care-partner. ° °

## 2020-08-14 ENCOUNTER — Telehealth: Payer: Self-pay | Admitting: *Deleted

## 2020-08-14 ENCOUNTER — Telehealth: Payer: Self-pay

## 2020-08-14 NOTE — Telephone Encounter (Signed)
Left message on follow up call. 

## 2020-08-14 NOTE — Telephone Encounter (Signed)
1. Have you developed a fever since your procedure? no  2.   Have you had an respiratory symptoms (SOB or cough) since your procedure? no  3.   Have you tested positive for COVID 19 since your procedure no  4.   Have you had any family members/close contacts diagnosed with the COVID 19 since your procedure?  no   If yes to any of these questions please route to Joylene John, RN and Joella Prince, RN Follow up Call-  Call back number 08/12/2020  Post procedure Call Back phone  # [phone]  Permission to leave phone message Yes  Some recent data might be hidden     Patient questions:  Do you have a fever, pain , or abdominal swelling? No. Pain Score  0 *  Have you tolerated food without any problems? Yes.    Have you been able to return to your normal activities? Yes.    Do you have any questions about your discharge instructions: Diet   No. Medications  No. Follow up visit  No.  Do you have questions or concerns about your Care? No.  Actions: * If pain score is 4 or above: No action needed, pain <4.

## 2021-04-07 DIAGNOSIS — M1811 Unilateral primary osteoarthritis of first carpometacarpal joint, right hand: Secondary | ICD-10-CM | POA: Diagnosis not present

## 2021-05-10 DIAGNOSIS — R062 Wheezing: Secondary | ICD-10-CM | POA: Diagnosis not present

## 2021-05-10 DIAGNOSIS — Z20822 Contact with and (suspected) exposure to covid-19: Secondary | ICD-10-CM | POA: Diagnosis not present

## 2021-05-10 DIAGNOSIS — R059 Cough, unspecified: Secondary | ICD-10-CM | POA: Diagnosis not present

## 2021-05-10 DIAGNOSIS — R6883 Chills (without fever): Secondary | ICD-10-CM | POA: Diagnosis not present

## 2021-05-10 DIAGNOSIS — B349 Viral infection, unspecified: Secondary | ICD-10-CM | POA: Diagnosis not present

## 2021-05-10 DIAGNOSIS — M791 Myalgia, unspecified site: Secondary | ICD-10-CM | POA: Diagnosis not present

## 2021-05-14 DIAGNOSIS — N898 Other specified noninflammatory disorders of vagina: Secondary | ICD-10-CM | POA: Diagnosis not present

## 2021-05-14 DIAGNOSIS — B2790 Infectious mononucleosis, unspecified without complication: Secondary | ICD-10-CM | POA: Diagnosis not present

## 2022-01-29 DIAGNOSIS — E559 Vitamin D deficiency, unspecified: Secondary | ICD-10-CM | POA: Diagnosis not present

## 2022-01-29 DIAGNOSIS — E538 Deficiency of other specified B group vitamins: Secondary | ICD-10-CM | POA: Diagnosis not present

## 2022-01-29 DIAGNOSIS — Z0000 Encounter for general adult medical examination without abnormal findings: Secondary | ICD-10-CM | POA: Diagnosis not present

## 2022-01-29 DIAGNOSIS — R7989 Other specified abnormal findings of blood chemistry: Secondary | ICD-10-CM | POA: Diagnosis not present

## 2022-01-29 DIAGNOSIS — D649 Anemia, unspecified: Secondary | ICD-10-CM | POA: Diagnosis not present

## 2022-02-11 DIAGNOSIS — R82998 Other abnormal findings in urine: Secondary | ICD-10-CM | POA: Diagnosis not present

## 2022-02-11 DIAGNOSIS — Z1339 Encounter for screening examination for other mental health and behavioral disorders: Secondary | ICD-10-CM | POA: Diagnosis not present

## 2022-02-11 DIAGNOSIS — Z0000 Encounter for general adult medical examination without abnormal findings: Secondary | ICD-10-CM | POA: Diagnosis not present

## 2022-02-11 DIAGNOSIS — Z1331 Encounter for screening for depression: Secondary | ICD-10-CM | POA: Diagnosis not present

## 2022-02-11 DIAGNOSIS — G4700 Insomnia, unspecified: Secondary | ICD-10-CM | POA: Diagnosis not present

## 2022-03-08 ENCOUNTER — Encounter: Payer: Self-pay | Admitting: Neurology

## 2022-03-09 ENCOUNTER — Encounter: Payer: Self-pay | Admitting: Neurology

## 2022-03-09 ENCOUNTER — Ambulatory Visit (INDEPENDENT_AMBULATORY_CARE_PROVIDER_SITE_OTHER): Payer: BC Managed Care – PPO | Admitting: Neurology

## 2022-03-09 VITALS — BP 97/64 | HR 59 | Ht 72.0 in | Wt 152.0 lb

## 2022-03-09 DIAGNOSIS — G478 Other sleep disorders: Secondary | ICD-10-CM

## 2022-03-09 DIAGNOSIS — R0683 Snoring: Secondary | ICD-10-CM

## 2022-03-09 DIAGNOSIS — F5104 Psychophysiologic insomnia: Secondary | ICD-10-CM | POA: Diagnosis not present

## 2022-03-09 NOTE — Patient Instructions (Signed)
Screening for Sleep Apnea  Sleep apnea is a condition in which breathing pauses or becomes shallow during sleep. Sleep apnea screening is a test to determine if you are at risk for sleep apnea. The test includes a series of questions. It will only takes a few minutes. Your health care provider may ask you to have this test in preparation for surgery or as part of a physical exam. What are the symptoms of sleep apnea? Common symptoms of sleep apnea include: Snoring. Waking up often at night. Daytime sleepiness. Pauses in breathing. Choking or gasping during sleep. Irritability. Forgetfulness. Trouble thinking clearly. Depression. Personality changes. Most people with sleep apnea do not know that they have it. What are the advantages of sleep apnea screening? Getting screened for sleep apnea can help: Ensure your safety. It is important for your health care providers to know whether or not you have sleep apnea, especially if you are having surgery or have other long-term (chronic) health conditions. Improve your health and allow you to get a better night's rest. Restful sleep can help you: Have more energy. Lose weight. Improve high blood pressure. Improve diabetes management. Prevent stroke. Prevent car accidents. What happens during the screening? Screening usually includes being asked a list of questions about your sleep quality. Some questions you may be asked include: Do you snore? Is your sleep restless? Do you have daytime sleepiness? Has a partner or spouse told you that you stop breathing during sleep? Have you had trouble concentrating or memory loss? What is your age? What is your neck circumference? To measure your neck, keep your back straight and gently wrap the tape measure around your neck. Put the tape measure at the middle of your neck, between your chin and collarbone. What is your sex assigned at birth? Do you have or are you being treated for high blood  pressure? If your screening test is positive, you are at risk for the condition. Further testing may be needed to confirm a diagnosis of sleep apnea. Where to find more information You can find screening tools online or at your health care clinic. For more information about sleep apnea screening and healthy sleep, visit these websites: Centers for Disease Control and Prevention: www.cdc.gov American Sleep Apnea Association: www.sleepapnea.org Contact a health care provider if: You think that you may have sleep apnea. Summary Sleep apnea screening can help determine if you are at risk for sleep apnea. It is important for your health care providers to know whether or not you have sleep apnea, especially if you are having surgery or have other chronic health conditions. You may be asked to take a screening test for sleep apnea in preparation for surgery or as part of a physical exam. This information is not intended to replace advice given to you by your health care provider. Make sure you discuss any questions you have with your health care provider. Document Revised: 06/06/2020 Document Reviewed: 06/06/2020 Elsevier Patient Education  2023 [employer]  

## 2022-03-09 NOTE — Progress Notes (Signed)
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   Chief concern according to patient :  " The snoring is getting worse, and we have to sleep separated". I have tried a mouth guard but don't tolerate it well. I have chronic insomnia "    I have the pleasure of seeing Monica Oconnell today, a right-handed White or Caucasian female with a possible sleep disorder.  She  has a past medical history of Allergy, Anxiety, Chronic constipation, and Seasonal rhinitis. She is status post turbinate reduction, no septal deviation was present. No large tonsil or adenoids. Her PCP has placed her on Ambien for sleep initiation insomnia.      Sleep relevant medical history: Nocturia 3-4 times- a lot -She is status post turbinate reduction, no septal deviation was present. No large tonsil or adenoids. Her PCP has placed her on Ambien for sleep initiation insomnia.  , ,    Family medical /sleep history: Father is a loud snorer- and insomnia- no other family member on CPAP with OSA. Social history:  Patient is working as Pharmacist, community - full time-  and lives in a household with spouse - one youngest son in [place], 1 sophomore ( Chiropractor)  and 1 senior in Film/video editor) college.   The patient currently works Pets are present - one dog. Tobacco use: none .   ETOH use: 3/ week maximum- snoring is worse too ,  Caffeine intake in form of Coffee( 1-2 cups in AM ) Soda( /) Tea ( /) no energy drinks. Regular exercise in form of yoga, weights , walking .   Hobbies : reading, golf, travel,  hiking.       Sleep habits are as follows: The patient's dinner time is between 6-7 PM. The patient goes to bed at 10-10.30 PM and continues to sleep for intervals of 2-3 hours, wakes for  bathroom breaks, the first time at 2 and again at 4 AM.   The preferred sleep position is supine with the support of 1 pillow, flat bed. Dreams are reportedly frequent. Sometimes she can remember them. 5.30 AM is the usual rise time. The patient wakes up at 5.30 AM with an alarm.  She reports not feeling refreshed or restored in AM, with symptoms such as dry mouth, morning headaches, and residual fatigue.  Naps are taken frequently, on day with long breaks or weekends- lasting from 60 to 90 minutes and are refreshing but interfere nocturnal sleep.  She still has regular periods.    Review of Systems: Out of a complete 14 system review, the patient complains of only the following symptoms, and all other reviewed systems are negative.:  Fatigue, sleepiness , snoring, fragmented sleep by nocturia, often 3 times a night- , chronic Insomnia since college , worsening over the last 5 years- Anxiety?[place]    How likely are you to doze in the following situations: 0 = not likely,  1 = slight chance, 2 = moderate chance, 3 = high chance   Sitting and Reading? Watching Television? Sitting inactive in a public place (theater or meeting)? As a passenger in a car for an hour without a break? Lying down in the afternoon when circumstances permit? Sitting and talking to someone? Sitting quietly after lunch without alcohol? In a car, while stopped for a few minutes in traffic?   Total = 6/ 24 points   FSS endorsed at 24/ 63 points.   GDS - NA   Social History   Socioeconomic History   Marital status: Married    Spouse name: Not on file   Number of children: 3   Years of education: Not on file   Highest education level: Not on file  Occupational History   Occupation: Dentist-Pediatric   Tobacco Use   Smoking  status: Never   Smokeless tobacco: Never  Substance and Sexual Activity   Alcohol use: Yes    Comment: one drink daily    Drug use: No   Sexual activity: Not on file  Other Topics Concern   Not on file  Social History Narrative   Daily caffeine    Social Determinants of Health   Financial Resource Strain: Not on file  Food Insecurity: Not on file  Transportation Needs: Not on file  Physical Activity: Not on file  Stress: Not on file  Social Connections: Not on file    Family History  Problem Relation Age of Onset   Breast cancer Maternal Grandmother    Colon polyps Mother    Colon polyps Father    Colon cancer Neg Hx    Esophageal cancer Neg Hx    Rectal cancer Neg Hx    Stomach cancer Neg Hx     Past Medical History:  Diagnosis Date   Allergy    Anxiety    Chronic constipation    improved since last colon with diet changes    Seasonal rhinitis     Past Surgical History:  Procedure Laterality Date   COLONOSCOPY     POLYPECTOMY     SEPTOPLASTY  04/2016     Current Outpatient Medications on File Prior to Visit  Medication Sig Dispense Refill   cyanocobalamin (VITAMIN B12) 1000 MCG/ML injection Inject 1,000 mcg into the skin every 21 ( twenty-one) days.     escitalopram (LEXAPRO) 20 MG tablet Take 20 mg by mouth daily.     iron polysaccharides (NIFEREX) 150 MG capsule Take 150 mg by mouth daily.     levocetirizine (XYZAL) 5 MG tablet Take 5 mg by mouth as directed.     zolpidem (AMBIEN) 10 MG tablet Take 10 mg by mouth at bedtime as needed.     No current facility-administered medications on file prior to visit.    Allergies  Allergen Reactions   Sulfa Antibiotics     Physical exam:  Today's Vitals   03/09/22 1005  BP: 97/64  Pulse: (!) 59  Weight: 152 lb (68.9 kg)  Height: 6' (1.829 m)   Body mass index is 20.61 kg/m.   Wt Readings from Last 3 Encounters:  03/09/22 152 lb (68.9 kg)  08/12/20 148 lb (67.1 kg)  07/25/20 148 lb (67.1 kg)      Ht Readings from Last 3 Encounters:  03/09/22 6' (1.829 m)  08/12/20 6' (1.829 m)  07/25/20 6' (1.829 m)      General: The patient is awake, alert and appears not in acute distress. The  patient is well groomed. Head: Normocephalic, atraumatic. Neck is supple.  Mallampati 2,  neck circumference:14 inches . Nasal airflow  patent.  Retrognathia is not seen.  Dental status: biological, retainers, braces.  Cardiovascular:  Regular rate and cardiac rhythm by pulse,  without distended neck veins. Respiratory: Lungs are clear to auscultation.  Skin:  Without evidence of ankle edema, or rash. Trunk: The patient's posture is erect.   Neurologic exam : The patient is awake and alert, oriented to place and time.   Memory subjective described as intact.  Attention span & concentration ability appears normal.  Speech is fluent,  without  dysarthria, dysphonia or aphasia.  Mood and affect are appropriate.   Cranial nerves: no loss of smell or taste reported  Pupils are equal and briskly reactive to light. Funduscopic exam deferred. .  Extraocular movements in vertical and horizontal planes were intact and without nystagmus. No Diplopia. Visual fields by finger perimetry are intact. Hearing was intact to soft voice and finger rubbing.   Facial sensation intact to fine touch. Facial motor strength is symmetric and tongue and uvula move midline.  Neck ROM : rotation, tilt and flexion extension were normal for age and shoulder shrug was symmetrical.    Motor exam:  Symmetric bulk, tone and ROM.   Normal tone without cog-wheeling, symmetric grip strength .   Sensory:  Fine touch, normal.  Proprioception tested in the upper extremities was normal.   Coordination: Rapid alternating movements in the fingers/hands were of normal speed.  The Finger-to-nose maneuver was intact without evidence of ataxia, dysmetria or tremor.   Gait and station: Patient could rise unassisted from a seated  position, walked without assistive device.  Stance is of normal width/ base and the patient turned with 3 steps.  Toe and heel walk were deferred.  Deep tendon reflexes: in the  upper and lower extremities are symmetric and intact.  Babinski response was deferred.        After spending a total time of  40  minutes face to face and additional time for physical and neurologic examination, review of laboratory studies,  personal review of imaging studies, reports and results of other testing and review of referral information / records as far as provided in visit, I have established the following assessments:  1) reported snoring, history of snoring in her father. No witnessed apnea.  2) low risk for OSA by airway anatomy, BMI and neck size.  3) no history of HTN, DM, or thyroid disease. Not yet peri-menopausal.  4) NOCTURIA.    My Plan is to proceed with:  1) PSG with RERAS scoring- !!! Suspect UARS- and need for screening for apnea and snoring.  Alternatively, tech note documentation, please.     I would like to thank Shon Baton, [place],  [place] [address] for allowing me to meet with and to take care of this pleasant patient.   In short, Missy Sabins , DDS , is presenting with snoring and chronic insomnia. I plan to follow up either personally within 3-4 month.   .  Electronically signed by: Larey Seat, MD 03/09/2022 10:22 AM  [facility] Board certified by The [employer] of Sleep Medicine and Diplomate of the [employer] of Sleep Medicine. Board certified In Neurology through the [place], Fellow of the [employer] of Neurology. Medical Director of [facility].

## 2022-03-29 ENCOUNTER — Telehealth: Payer: Self-pay | Admitting: Neurology

## 2022-03-29 DIAGNOSIS — R0683 Snoring: Secondary | ICD-10-CM

## 2022-03-29 DIAGNOSIS — G478 Other sleep disorders: Secondary | ICD-10-CM

## 2022-03-29 DIAGNOSIS — F5104 Psychophysiologic insomnia: Secondary | ICD-10-CM

## 2022-03-29 NOTE — Telephone Encounter (Signed)
[employer] denied the NPSG.  They approved the HST. Please put an order in for HST. Thank you

## 2022-03-31 ENCOUNTER — Telehealth: Payer: Self-pay | Admitting: Neurology

## 2022-03-31 NOTE — Telephone Encounter (Signed)
HST- [employer] Josem Kaufmann: [id number] (exp. 03/29/22 to 05/27/22).  Patient is scheduled at [facility] for 05/04/22 at 2 pm.  Mailed packet to the patient.

## 2022-04-06 NOTE — Telephone Encounter (Signed)
Patient HST is scheduled on 05/04/22.

## 2022-04-13 DIAGNOSIS — Z1389 Encounter for screening for other disorder: Secondary | ICD-10-CM | POA: Diagnosis not present

## 2022-04-13 DIAGNOSIS — Z01419 Encounter for gynecological examination (general) (routine) without abnormal findings: Secondary | ICD-10-CM | POA: Diagnosis not present

## 2022-04-13 DIAGNOSIS — Z1231 Encounter for screening mammogram for malignant neoplasm of breast: Secondary | ICD-10-CM | POA: Diagnosis not present

## 2022-04-13 DIAGNOSIS — N920 Excessive and frequent menstruation with regular cycle: Secondary | ICD-10-CM | POA: Diagnosis not present

## 2022-04-20 DIAGNOSIS — L819 Disorder of pigmentation, unspecified: Secondary | ICD-10-CM | POA: Diagnosis not present

## 2022-04-20 DIAGNOSIS — D2261 Melanocytic nevi of right upper limb, including shoulder: Secondary | ICD-10-CM | POA: Diagnosis not present

## 2022-04-20 DIAGNOSIS — D2262 Melanocytic nevi of left upper limb, including shoulder: Secondary | ICD-10-CM | POA: Diagnosis not present

## 2022-04-20 DIAGNOSIS — D225 Melanocytic nevi of trunk: Secondary | ICD-10-CM | POA: Diagnosis not present

## 2022-04-20 DIAGNOSIS — D1801 Hemangioma of skin and subcutaneous tissue: Secondary | ICD-10-CM | POA: Diagnosis not present

## 2022-05-04 ENCOUNTER — Ambulatory Visit: Payer: BC Managed Care – PPO | Admitting: Neurology

## 2022-05-04 DIAGNOSIS — F5104 Psychophysiologic insomnia: Secondary | ICD-10-CM

## 2022-05-04 DIAGNOSIS — G4710 Hypersomnia, unspecified: Secondary | ICD-10-CM | POA: Diagnosis not present

## 2022-05-04 DIAGNOSIS — G478 Other sleep disorders: Secondary | ICD-10-CM

## 2022-05-04 DIAGNOSIS — R0683 Snoring: Secondary | ICD-10-CM

## 2022-05-06 NOTE — Progress Notes (Signed)
[facility]   Dr. Missy Sabins, DDS HOME SLEEP TEST REPORT ( by Watch PAT)   STUDY DATE:  05-06-2022   ORDERING CLINICIAN: Larey Seat, MD  REFERRING CLINICIAN: Dr Virgina Jock, MD   CLINICAL INFORMATION/HISTORY: Sleep relevant medical history: Nocturia 3-4 times each night-She is status post turbinate reduction, no septal deviation was present, neither large tonsil or adenoids. Her PCP has placed her on Ambien for sleep initiation insomnia.   Epworth sleepiness score:6 /24. FSS at 24/ 63 points.    BMI: 20.6 kg/m   Neck Circumference: 14"   FINDINGS:   Sleep Summary:   Total Recording Time (hours, min): 8 hours and 5 minutes of which 7 hours 27 minutes with a total recorded sleep time.         Percent REM (%): 21.7%                                      Respiratory Indices:   Calculated pAHI (per hour):     7.1/h                        REM pAHI:     9.4/h                                            NREM pAHI: 6.4/h                             Positional AHI: In supine sleep the AHI was 12.8/h and in nonsupine sleep 1.6/h.  Snoring statistics show a mean volume of 40 dB which is just at threshold for pickup.  Snoring was only present for 5% of the total sleep time.                                                  Oxygen Saturation Statistics:    O2 Saturation Range (%):    Between a nadir at 87% with a maximum at 99% with a mean saturation of 96%.                                   O2 Saturation (minutes) <89%:   0 minutes        Pulse Rate Statistics:   Pulse Mean (bpm):   61 bpm              Pulse Range:     Between 49 and 91 bpm            IMPRESSION:  This HST confirms the presence of positional dependent and very mild obstructive sleep apnea.    RECOMMENDATION: I would recommend positional therapy, avoiding supine sleep.  I do not think that a CPAP therapy or dental device is advisable. I also would like to remark upon the very fragmented  sleep architecture.  During this home sleep test there was one very brief.  At approximately 1 AM for sleep was interrupted.  There is an age-appropriate proportion of  REM sleep noted, and there was enough sleep seen in left and right lateral position to states that positional treatment is the only intervention needed. This can be the classic tennis-ball method or achieved by a phillips Respironics sleep belt.  I consider apnea not to be a contributing cause to insomnia.    INTERPRETING PHYSICIAN:   Electronically signed by: Larey Seat, MD 05/11/2022 8:52 AM  [facility] Board certified by The [employer] of Sleep Medicine and Diplomate of the [employer] of Sleep Medicine. Board certified In Neurology through the [place], Fellow of the [employer] of Neurology. Medical Director of [employer] [employer].

## 2022-05-11 ENCOUNTER — Telehealth: Payer: Self-pay | Admitting: *Deleted

## 2022-05-11 NOTE — Procedures (Signed)
[facility]   Dr. Missy Sabins, DDS HOME SLEEP TEST REPORT ( by Watch PAT)   STUDY DATE:  05-06-2022   ORDERING CLINICIAN: Larey Seat, MD  REFERRING CLINICIAN: Dr Virgina Jock, MD   CLINICAL INFORMATION/HISTORY: Sleep relevant medical history: Nocturia 3-4 times each night-She is status post turbinate reduction, no septal deviation was present, neither large tonsil or adenoids. Her PCP has placed her on Ambien for sleep initiation insomnia.   Epworth sleepiness score:6 /24. FSS at 24/ 63 points.    BMI: 20.6 kg/m   Neck Circumference: 14"   FINDINGS:   Sleep Summary:   Total Recording Time (hours, min): 8 hours and 5 minutes of which 7 hours 27 minutes with a total recorded sleep time.         Percent REM (%): 21.7%                                      Respiratory Indices:   Calculated pAHI (per hour):     7.1/h                        REM pAHI:     9.4/h                                            NREM pAHI: 6.4/h                             Positional AHI: In supine sleep the AHI was 12.8/h and in nonsupine sleep 1.6/h.  Snoring statistics show a mean volume of 40 dB which is just at threshold for pickup.  Snoring was only present for 5% of the total sleep time.                                                  Oxygen Saturation Statistics:    O2 Saturation Range (%):    Between a nadir at 87% with a maximum at 99% with a mean saturation of 96%.                                   O2 Saturation (minutes) <89%:   0 minutes        Pulse Rate Statistics:   Pulse Mean (bpm):   61 bpm              Pulse Range:     Between 49 and 91 bpm            IMPRESSION:  This HST confirms the presence of positional dependent and very mild obstructive sleep apnea.    RECOMMENDATION: I would recommend positional therapy, avoiding supine sleep.  I do not think that a CPAP therapy or dental device is advisable. I also would like to remark upon the very fragmented sleep  architecture.  During this home sleep test there was one very brief.  At approximately 1 AM for sleep was interrupted.  There is an age-appropriate proportion of REM  sleep noted, and there was enough sleep seen in left and right lateral position to states that positional treatment is the only intervention needed. This can be the classic tennis-ball method or achieved by a phillips Respironics sleep belt.  I consider apnea not to be a contributing cause to insomnia.    INTERPRETING PHYSICIAN:   Electronically signed by: Larey Seat, MD 05/11/2022 8:52 AM  [facility] Board certified by The [employer] of Sleep Medicine and Diplomate of the [employer] of Sleep Medicine. Board certified In Neurology through the [place], Fellow of the [employer] of Neurology. Medical Director of [employer].

## 2022-05-11 NOTE — Telephone Encounter (Signed)
-----   Message from Larey Seat, MD sent at 05/11/2022  9:03 AM EDT ----- Positional AHI: In supine sleep the AHI was 12.8/h and in nonsupine sleep 1.6/h. Snoring statistics show a mean volume of 40 dB which is just at threshold for pickup.  Snoring was only present for 5% of the total sleep time. IMPRESSION:  This HST confirms the presence of positional dependent and very mild obstructive sleep apnea.   RECOMMENDATION: I would recommend positional therapy, avoiding supine sleep.  I do not think that a CPAP therapy or dental device is advisable. I also would like to remark upon the very fragmented sleep architecture.  During this home sleep test there was one very brief.  At approximately 1 AM for sleep was interrupted.  There is an age-appropriate proportion of REM sleep noted, and there was enough sleep seen in left and right lateral position to states that positional treatment is the only intervention needed. This can be the classic tennis-ball method or achieved by a phillips Respironics sleep belt.  I consider apnea not to be a contributing cause to insomnia.

## 2022-05-11 NOTE — Telephone Encounter (Signed)
LVM for pt to call about results. °

## 2022-05-11 NOTE — Progress Notes (Signed)
Positional AHI: In supine sleep the AHI was 12.8/h and in nonsupine sleep 1.6/h. Snoring statistics show a mean volume of 40 dB which is just at threshold for pickup.  Snoring was only present for 5% of the total sleep time. IMPRESSION:  This HST confirms the presence of positional dependent and very mild obstructive sleep apnea.   RECOMMENDATION: I would recommend positional therapy, avoiding supine sleep.  I do not think that a CPAP therapy or dental device is advisable. I also would like to remark upon the very fragmented sleep architecture.  During this home sleep test there was one very brief.  At approximately 1 AM for sleep was interrupted.  There is an age-appropriate proportion of REM sleep noted, and there was enough sleep seen in left and right lateral position to states that positional treatment is the only intervention needed. This can be the classic tennis-ball method or achieved by a phillips Respironics sleep belt.  I consider apnea not to be a contributing cause to insomnia.

## 2022-05-11 NOTE — Telephone Encounter (Signed)
Pt called. Said she talked to Dr. Brett Fairy about her results already.

## 2022-06-22 DIAGNOSIS — N920 Excessive and frequent menstruation with regular cycle: Secondary | ICD-10-CM | POA: Diagnosis not present

## 2022-07-15 DIAGNOSIS — F332 Major depressive disorder, recurrent severe without psychotic features: Secondary | ICD-10-CM | POA: Diagnosis not present

## 2022-07-15 DIAGNOSIS — F411 Generalized anxiety disorder: Secondary | ICD-10-CM | POA: Diagnosis not present

## 2022-07-19 DIAGNOSIS — N920 Excessive and frequent menstruation with regular cycle: Secondary | ICD-10-CM | POA: Diagnosis not present

## 2022-07-20 DIAGNOSIS — N840 Polyp of corpus uteri: Secondary | ICD-10-CM | POA: Diagnosis not present

## 2022-07-23 DIAGNOSIS — K5900 Constipation, unspecified: Secondary | ICD-10-CM | POA: Diagnosis not present

## 2022-08-20 DIAGNOSIS — F332 Major depressive disorder, recurrent severe without psychotic features: Secondary | ICD-10-CM | POA: Diagnosis not present

## 2022-08-20 DIAGNOSIS — R3915 Urgency of urination: Secondary | ICD-10-CM | POA: Diagnosis not present

## 2022-08-20 DIAGNOSIS — R351 Nocturia: Secondary | ICD-10-CM | POA: Diagnosis not present

## 2022-08-20 DIAGNOSIS — R350 Frequency of micturition: Secondary | ICD-10-CM | POA: Diagnosis not present

## 2022-09-29 DIAGNOSIS — F332 Major depressive disorder, recurrent severe without psychotic features: Secondary | ICD-10-CM | POA: Diagnosis not present

## 2022-10-29 DIAGNOSIS — F419 Anxiety disorder, unspecified: Secondary | ICD-10-CM | POA: Diagnosis not present

## 2022-11-02 DIAGNOSIS — F332 Major depressive disorder, recurrent severe without psychotic features: Secondary | ICD-10-CM | POA: Diagnosis not present

## 2022-11-30 DIAGNOSIS — F419 Anxiety disorder, unspecified: Secondary | ICD-10-CM | POA: Diagnosis not present

## 2022-12-10 ENCOUNTER — Other Ambulatory Visit: Payer: Self-pay | Admitting: Internal Medicine

## 2022-12-10 DIAGNOSIS — Z0000 Encounter for general adult medical examination without abnormal findings: Secondary | ICD-10-CM

## 2022-12-10 DIAGNOSIS — R7989 Other specified abnormal findings of blood chemistry: Secondary | ICD-10-CM

## 2022-12-14 ENCOUNTER — Ambulatory Visit
Admission: RE | Admit: 2022-12-14 | Discharge: 2022-12-14 | Disposition: A | Discharge status: Home / Self Care | Payer: PRIVATE HEALTH INSURANCE | Source: Ambulatory Visit | Attending: Internal Medicine | Admitting: Internal Medicine

## 2022-12-14 DIAGNOSIS — F332 Major depressive disorder, recurrent severe without psychotic features: Secondary | ICD-10-CM | POA: Diagnosis not present

## 2022-12-14 DIAGNOSIS — R7989 Other specified abnormal findings of blood chemistry: Secondary | ICD-10-CM

## 2022-12-14 DIAGNOSIS — Z0000 Encounter for general adult medical examination without abnormal findings: Secondary | ICD-10-CM

## 2022-12-21 DIAGNOSIS — F419 Anxiety disorder, unspecified: Secondary | ICD-10-CM | POA: Diagnosis not present

## 2023-01-18 DIAGNOSIS — F419 Anxiety disorder, unspecified: Secondary | ICD-10-CM | POA: Diagnosis not present

## 2023-01-25 DIAGNOSIS — M79641 Pain in right hand: Secondary | ICD-10-CM | POA: Diagnosis not present

## 2023-02-01 DIAGNOSIS — F332 Major depressive disorder, recurrent severe without psychotic features: Secondary | ICD-10-CM | POA: Diagnosis not present

## 2023-03-28 ENCOUNTER — Emergency Department (HOSPITAL_COMMUNITY): Payer: BC Managed Care – PPO

## 2023-03-28 ENCOUNTER — Other Ambulatory Visit: Payer: Self-pay

## 2023-03-28 ENCOUNTER — Encounter (HOSPITAL_COMMUNITY): Payer: Self-pay

## 2023-03-28 ENCOUNTER — Emergency Department (HOSPITAL_COMMUNITY)
Admission: EM | Admit: 2023-03-28 | Discharge: 2023-03-28 | Disposition: A | Discharge status: Home / Self Care | Payer: BC Managed Care – PPO | Attending: Emergency Medicine | Admitting: Emergency Medicine

## 2023-03-28 DIAGNOSIS — I471 Supraventricular tachycardia, unspecified: Secondary | ICD-10-CM | POA: Diagnosis not present

## 2023-03-28 DIAGNOSIS — R002 Palpitations: Secondary | ICD-10-CM | POA: Diagnosis not present

## 2023-03-28 DIAGNOSIS — R0602 Shortness of breath: Secondary | ICD-10-CM | POA: Diagnosis not present

## 2023-03-28 DIAGNOSIS — R000 Tachycardia, unspecified: Secondary | ICD-10-CM | POA: Diagnosis not present

## 2023-03-28 LAB — CBC
HCT: 45 % (ref 36.0–46.0)
Hemoglobin: 15.4 g/dL — ABNORMAL HIGH (ref 12.0–15.0)
MCH: 31.9 pg (ref 26.0–34.0)
MCHC: 34.2 g/dL (ref 30.0–36.0)
MCV: 93.2 fL (ref 80.0–100.0)
Platelets: 234 10*3/uL (ref 150–400)
RBC: 4.83 MIL/uL (ref 3.87–5.11)
RDW: 12.7 % (ref 11.5–15.5)
WBC: 6.3 10*3/uL (ref 4.0–10.5)
nRBC: 0 % (ref 0.0–0.2)

## 2023-03-28 LAB — I-STAT CHEM 8, ED
BUN: 15 mg/dL (ref 6–20)
Calcium, Ion: 1.26 mmol/L (ref 1.15–1.40)
Chloride: 100 mmol/L (ref 98–111)
Creatinine, Ser: 0.8 mg/dL (ref 0.44–1.00)
Glucose, Bld: 106 mg/dL — ABNORMAL HIGH (ref 70–99)
HCT: 46 % (ref 36.0–46.0)
Hemoglobin: 15.6 g/dL — ABNORMAL HIGH (ref 12.0–15.0)
Potassium: 4.3 mmol/L (ref 3.5–5.1)
Sodium: 134 mmol/L — ABNORMAL LOW (ref 135–145)
TCO2: 24 mmol/L (ref 22–32)

## 2023-03-28 LAB — BASIC METABOLIC PANEL
Anion gap: 10 (ref 5–15)
BUN: 16 mg/dL (ref 6–20)
CO2: 22 mmol/L (ref 22–32)
Calcium: 9.1 mg/dL (ref 8.9–10.3)
Chloride: 100 mmol/L (ref 98–111)
Creatinine, Ser: 0.82 mg/dL (ref 0.44–1.00)
GFR, Estimated: >60 mL/min (ref >60)
Glucose, Bld: 110 mg/dL — ABNORMAL HIGH (ref 70–99)
Potassium: 4 mmol/L (ref 3.5–5.1)
Sodium: 132 mmol/L — ABNORMAL LOW (ref 135–145)

## 2023-03-28 LAB — T4, FREE: Free T4: 0.84 ng/dL (ref 0.61–1.12)

## 2023-03-28 LAB — TSH: TSH: 3.568 u[IU]/mL (ref 0.350–4.500)

## 2023-03-28 LAB — HCG, SERUM, QUALITATIVE: Preg, Serum: NEGATIVE

## 2023-03-28 MED ORDER — DILTIAZEM HCL 25 MG/5ML IV SOLN
INTRAVENOUS | Status: AC
  Filled 2023-03-28: qty 5

## 2023-03-28 NOTE — ED Provider Notes (Signed)
EMERGENCY DEPARTMENT AT [facility] Provider Note   CSN: [id number] Arrival date & time: 03/28/23  0915     History  Chief Complaint  Patient presents with   Tachycardia    Monica Oconnell is a 52 y.o. female.  Who is otherwise healthy presenting for palpitations.  She works as a Financial trader and first experienced tachycardia while at work this morning.  She used a pulse oximeter in her office and found her heart rate to be close to 200 prompting her to seek evaluation in the emergency department.  Aside from palpitations she is not have chest pain or shortness of breath.  No nausea vomiting diaphoresis, fevers, chills or recent illness.  No prior history of tachycardia or SVT.  No new medications.  HPI     Home Medications Prior to Admission medications   Medication Sig Start Date End Date Taking? Authorizing Provider  cyanocobalamin (VITAMIN B12) 1000 MCG/ML injection Inject 1,000 mcg into the skin every 30 (thirty) days.   Yes [provider]  DULoxetine (CYMBALTA) 20 MG capsule Take 40 mg by mouth at bedtime. 03/26/23  Yes [provider]  hydrOXYzine (ATARAX) 25 MG tablet Take 25 mg by mouth as needed for anxiety.   Yes [provider]  iron polysaccharides (NIFEREX) 150 MG capsule Take 150 mg by mouth at bedtime. 06/06/20  Yes [provider]  levocetirizine (XYZAL) 5 MG tablet Take 5 mg by mouth at bedtime. 02/12/12  Yes [provider]  zolpidem (AMBIEN) 10 MG tablet Take 10 mg by mouth at bedtime as needed. 04/22/21  Yes [provider]      Allergies    Sulfa antibiotics    Review of Systems   Review of Systems  Cardiovascular:  Positive for palpitations.  All other systems reviewed and are negative.   Physical Exam Updated Vital Signs BP 98/67   Pulse 78   Temp (!) 97.4 F (36.3 C) (Oral)   Resp 14   Wt 66.2 kg   SpO2 98%   BMI 19.80 kg/m  Physical Exam Vitals and nursing note  reviewed.  HENT:     Head: Normocephalic and atraumatic.  Eyes:     Pupils: Pupils are equal, round, and reactive to light.  Cardiovascular:     Rate and Rhythm: Regular rhythm. Tachycardia present.  Pulmonary:     Effort: Pulmonary effort is normal.     Breath sounds: Normal breath sounds.  Abdominal:     Palpations: Abdomen is soft.     Tenderness: There is no abdominal tenderness.  Skin:    General: Skin is warm and dry.  Neurological:     Mental Status: She is alert.  Psychiatric:        Mood and Affect: Mood normal.     ED Results / Procedures / Treatments   Labs (all labs ordered are listed, but only abnormal results are displayed) Labs Reviewed  BASIC METABOLIC PANEL - Abnormal; Notable for the following components:      Result Value   Sodium 132 (*)    Glucose, Bld 110 (*)    All other components within normal limits  CBC - Abnormal; Notable for the following components:   Hemoglobin 15.4 (*)    All other components within normal limits  I-STAT CHEM 8, ED - Abnormal; Notable for the following components:   Sodium 134 (*)    Glucose, Bld 106 (*)    Hemoglobin 15.6 (*)  All other components within normal limits  HCG, SERUM, QUALITATIVE  TSH  T4, FREE    EKG EKG Interpretation Date/Time:  Monday March 28 2023 09:36:06 EDT Ventricular Rate:  91 PR Interval:  156 QRS Duration:  93 QT Interval:  312 QTC Calculation: 384 R Axis:   88  Text Interpretation: Sinus rhythm Borderline ST depression, diffuse leads Confirmed by Estelle June [phone]) on 03/28/2023 11:05:27 AM  Radiology DG Chest Port 1 View  Result Date: 03/28/2023 CLINICAL DATA:  Tachycardia and shortness of breath EXAM: PORTABLE CHEST 1 VIEW COMPARISON:  None Available. FINDINGS: Normal lung volumes. No focal consolidations. No pleural effusion or pneumothorax. The heart size and mediastinal contours are within normal limits. No acute osseous abnormality. IMPRESSION: No active disease.  Electronically Signed   By: Agustin Cree M.D.   On: 03/28/2023 10:35    Procedures .Critical Care  Performed by: Royanne Foots, DO Authorized by: Royanne Foots, DO   Critical care provider statement:    Critical care time (minutes):  30   Critical care time was exclusive of:  Separately billable procedures and treating other patients   Critical care was necessary to treat or prevent imminent or life-threatening deterioration of the following conditions:  Cardiac failure and shock   Critical care was time spent personally by me on the following activities:  Development of treatment plan with patient or surrogate, discussions with consultants, evaluation of patient's response to treatment, examination of patient, ordering and review of laboratory studies, ordering and review of radiographic studies, ordering and performing treatments and interventions, pulse oximetry, re-evaluation of patient's condition and review of old charts   I assumed direction of critical care for this patient from another provider in my specialty: no   Comments:     SVT with hypotension.  Patient required chemical cardioversion with Cardizem.     Medications Ordered in ED Medications  diltiazem (CARDIZEM) 25 MG/5ML injection (has no administration in time range)    ED Course/ Medical Decision Making/ A&P                                 Medical Decision Making 52 year old female who is otherwise healthy presenting for SVT with acute onset around 0830.  No prior episodes of SVT.  Slightly hypotensive upon initial arrival.  2 attempts at vagal maneuvers failed to convert her back to normal sinus rhythm.  After IV access was established we administered 0.25 mg/kg (60 mg) IV Cardizem which was effective in converting her back to normal sinus rhythm.  She has remained in normal sinus rhythm through the duration of her ED stay and is normotensive and feeling much better.  Unclear etiology of first SVT occurrence.  No  major underlying metabolic abnormalities.  Thyroid studies have also been sent.  We discussed initiation of beta-blocker here today but patient prefers to go home and follow-up with her primary care doctor to discuss further workup and management.  Stable for discharge  Amount and/or Complexity of Data Reviewed Labs: ordered. Radiology: ordered.           Final Clinical Impression(s) / ED Diagnoses Final diagnoses:  SVT (supraventricular tachycardia)    Rx / DC Orders ED Discharge Orders     None         Royanne Foots, DO 03/28/23 1108

## 2023-03-28 NOTE — Discharge Instructions (Addendum)
You were seen in the emergency department for a fast heart rate The EKG showed something called SVT (supraventricular tachycardia) We were able to get you back into a normal rhythm after giving a medication called diltiazem (Cardizem) It is important that you follow-up with your primary care doctor to discuss today's visit and further testing to look for potential causes of SVT Your primary doctor may or may not want to start you on a new medication as well Return to the emergency department for repeated episodes of palpitations, chest pain, shortness of breath or any other concerns

## 2023-03-28 NOTE — ED Triage Notes (Signed)
Pt complaining of high HR, and SOB that began this morning. Pt denies any hx of SVT. HR 180's in triage.

## 2023-04-04 DIAGNOSIS — F332 Major depressive disorder, recurrent severe without psychotic features: Secondary | ICD-10-CM | POA: Diagnosis not present

## 2023-04-06 ENCOUNTER — Ambulatory Visit: Payer: BC Managed Care – PPO | Attending: Cardiovascular Disease

## 2023-04-06 ENCOUNTER — Encounter: Payer: Self-pay | Admitting: Cardiovascular Disease

## 2023-04-06 ENCOUNTER — Ambulatory Visit: Payer: BC Managed Care – PPO | Admitting: Cardiovascular Disease

## 2023-04-06 ENCOUNTER — Ambulatory Visit: Payer: BC Managed Care – PPO | Attending: Cardiovascular Disease | Admitting: Cardiovascular Disease

## 2023-04-06 VITALS — BP 90/62 | HR 70 | Ht 72.0 in

## 2023-04-06 DIAGNOSIS — I471 Supraventricular tachycardia, unspecified: Secondary | ICD-10-CM

## 2023-04-06 MED ORDER — DILTIAZEM HCL 30 MG PO TABS: 30.0000 mg | ORAL_TABLET | Freq: Four times a day (QID) | ORAL | 3 refills | Status: AC | PRN

## 2023-04-06 NOTE — Progress Notes (Signed)
Cardiology Office Note:    Date:  04/06/2023   ID:  Monica Oconnell, DOB Dec 04, 1970, MRN [id number]  PCP:  Creola Corn, MD   [facility] Providers Cardiologist:  Tonny Bollman, MD     Referring MD: Creola Corn, MD   Chief Complaint  Patient presents with   Palpitations    History of Present Illness:    Monica Oconnell is a 52 y.o. female presenting for evaluation of SVT.  The patient is healthy, exercises regularly, and has no exertional chest pain or pressure.  She has never had any heart problems in the past.  Her grandfather had a pacemaker but there is no other family history of cardiac disease.  She was in her normal state of health on September 16 when she developed sudden onset of tachypalpitations.  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  She was not under any unusual stress and had normal caffeine intake that day.  She denies any other confounders.  Her heart rate was initially measured at greater than 200 bpm and she went to the emergency room for further evaluation.  Upon arrival there, she was found to be in SVT.  Vagal maneuvers were attempted but they were unsuccessful.  She was given a bolus of IV diltiazem and ultimately converted back to sinus rhythm with stable hemodynamics throughout.  She has had no recurrent palpitations since that time.  She reports 1 other recent episode a few weeks ago when she was out walking with a neighbor and she felt weak but she did not have heart palpitations associated with this.  Past Medical History:  Diagnosis Date   Allergy    Anxiety    Chronic constipation    improved since last colon with diet changes    Seasonal rhinitis    SVT (supraventricular tachycardia)     Past Surgical History:  Procedure Laterality Date   COLONOSCOPY     POLYPECTOMY     SEPTOPLASTY  04/2016    Current Medications: Current Meds  Medication Sig   cyanocobalamin (VITAMIN B12) 1000 MCG/ML injection Inject  1,000 mcg into the skin every 30 (thirty) days.   DULoxetine (CYMBALTA) 20 MG capsule Take 40 mg by mouth at bedtime.   hydrOXYzine (ATARAX) 25 MG tablet Take 25 mg by mouth as needed for anxiety.   iron polysaccharides (NIFEREX) 150 MG capsule Take 150 mg by mouth at bedtime.   levocetirizine (XYZAL) 5 MG tablet Take 5 mg by mouth at bedtime.   zolpidem (AMBIEN) 10 MG tablet Take 10 mg by mouth at bedtime as needed.     Allergies:   Sulfa antibiotics   Social History   Socioeconomic History   Marital status: Married    Spouse name: Not on file   Number of children: 3   Years of education: Not on file   Highest education level: Not on file  Occupational History   Occupation: Dentist-Pediatric   Tobacco Use   Smoking status: Never   Smokeless tobacco: Never  Substance and Sexual Activity   Alcohol use: Yes    Comment: one drink daily    Drug use: No   Sexual activity: Not on file  Other Topics Concern   Not on file  Social History Narrative   Daily caffeine    Social Determinants of Health   Financial Resource Strain: Not on file  Food Insecurity: Not on file  Transportation Needs: Not on file  Physical  Activity: Not on file  Stress: Not on file  Social Connections: Not on file     Family History: The patient's family history includes Breast cancer in her maternal grandmother; Colon polyps in her father and mother. There is no history of Colon cancer, Esophageal cancer, Rectal cancer, or Stomach cancer.  ROS:   Please see the history of present illness.    All other systems reviewed and are negative.  EKGs/Labs/Other Studies Reviewed:         Recent Labs: 03/28/2023: BUN 15; Creatinine, Ser 0.80; Hemoglobin 15.6; Platelets 234; Potassium 4.3; Sodium 134; TSH 3.568  Recent Lipid Panel No results found for: "CHOL", "TRIG", "HDL", "CHOLHDL", "VLDL", "LDLCALC", "LDLDIRECT"   Risk Assessment/Calculations:                Physical Exam:    VS:  BP 90/62 (BP  Location: Right Arm, Patient Position: Sitting, Cuff Size: Normal)   Pulse 70   Ht 6' (1.829 m)   SpO2 94%   BMI 19.80 kg/m     Wt Readings from Last 3 Encounters:  03/28/23 146 lb (66.2 kg)  03/09/22 152 lb (68.9 kg)  08/12/20 148 lb (67.1 kg)     GEN:  Well nourished, well developed in no acute distress HEENT: Normal NECK: No JVD; No carotid bruits LYMPHATICS: No lymphadenopathy CARDIAC: RRR, no murmurs, rubs, gallops RESPIRATORY:  Clear to auscultation without rales, wheezing or rhonchi  ABDOMEN: Soft, non-tender, non-distended MUSCULOSKELETAL:  No edema; No deformity  SKIN: Warm and dry NEUROLOGIC:  Alert and oriented x 3 PSYCHIATRIC:  Normal affect   ASSESSMENT:    1. SVT (supraventricular tachycardia)    PLAN:    In order of problems listed above:  I reviewed all the patient's ER records.  Her EKG initially showed narrow complex tachycardia consistent with SVT at a rate of 190 bpm with diffuse ST and T wave abnormality likely rate related.  After conversion to sinus rhythm, her EKG was within normal limits with no evidence of WPW.  The patient has a normal exam and no other symptoms to suspect structural or ischemic heart disease.  I have recommended a 2D echocardiogram for further evaluation and also a 2-week ZIO monitor to better evaluate her rhythm.  I wrote her a prescription for diltiazem 30 mg to be used as needed for SVT recurrence.  We discussed other treatment strategies including EP referral for consideration of ablation if she were to have a recurrence.  I do not think she will tolerate any maintenance medication like a long-acting beta-blocker or calcium channel blocker as her baseline blood pressure is too low for this.  I will plan to see her back in 1 year for follow-up as long as no problems arise in the interim.  We will touch base with her once her echocardiogram and ZIO monitor results are available.     Medication Adjustments/Labs and Tests  Ordered: Current medicines are reviewed at length with the patient today.  Concerns regarding medicines are outlined above.  No orders of the defined types were placed in this encounter.  No orders of the defined types were placed in this encounter.   Patient Instructions  Medication Instructions:  Your physician has recommended you make the following change in your medication:  1-Take Diltiazem 30 mg by mouth as need for elevated heart rate, can take again in 1 hour if heart rate does not come down.  *If you need a refill on your cardiac medications before  your next appointment, please call your pharmacy*  Lab Work: If you have labs (blood work) drawn today and your tests are completely normal, you will receive your results only by: MyChart Message (if you have MyChart) OR A paper copy in the mail If you have any lab test that is abnormal or we need to change your treatment, we will call you to review the results.  Testing/Procedures: Your physician has requested that you have an echocardiogram. Echocardiography is a painless test that uses sound waves to create images of your heart. It provides your doctor with information about the size and shape of your heart and how well your heart's chambers and valves are working. This procedure takes approximately one hour. There are no restrictions for this procedure. Please do NOT wear cologne, perfume, aftershave, or lotions (deodorant is allowed). Please arrive 15 minutes prior to your appointment time.  Your physician has recommended that you wear a monitor for 2 weeks. The monitors are medical devices that record the heart's electrical activity. Doctors most often [place] these monitors to diagnose arrhythmias. Arrhythmias are problems with the speed or rhythm of the heartbeat. The monitor is a small, portable device. You can wear one while you do your normal daily activities. This is usually used to diagnose what is causing palpitations/syncope  (passing out).  Follow-Up: At [facility], you and your health needs are our priority.  As part of our continuing mission to provide you with exceptional heart care, we have created designated Provider Care Teams.  These Care Teams include your primary Cardiologist (physician) and Advanced Practice Providers (APPs -  Physician Assistants and Nurse Practitioners) who all work together to provide you with the care you need, when you need it.  We recommend signing up for the patient portal called "MyChart".  Sign up information is provided on this After Visit Summary.  MyChart is used to connect with patients for Virtual Visits (Telemedicine).  Patients are able to view lab/test results, encounter notes, upcoming appointments, etc.  Non-urgent messages can be sent to your provider as well.   To learn more about what you can do with MyChart, go to [email or web address].    Your next appointment:   1 year(s)  Provider:   Tonny Bollman, MD     Other Instructions Christena Deem- Long Term Monitor Instructions  Your physician has requested you wear a ZIO patch monitor for 14 days.  This is a single patch monitor. [employer] supplies one patch monitor per enrollment. Additional stickers are not available. Please do not apply patch if you will be having a Nuclear Stress Test,  Echocardiogram, Cardiac CT, MRI, or Chest Xray during the period you would be wearing the  monitor. The patch cannot be worn during these tests. You cannot remove and re-apply the  ZIO XT patch monitor.  Your ZIO patch monitor will be mailed 3 day USPS to your address on file. It may take 3-5 days  to receive your monitor after you have been enrolled.  Once you have received your monitor, please review the enclosed instructions. Your monitor  has already been registered assigning a specific monitor serial # to you.  Billing and Patient Assistance Program Information  We have supplied [employer] with any of your insurance  information on file for billing purposes. [employer] offers a sliding scale Patient Assistance Program for patients that do not have  insurance, or whose insurance does not completely cover the cost of the ZIO monitor.  You must apply  for the Patient Assistance Program to qualify for this discounted rate.  To apply, please call [employer] at [phone], select option 4, select option 2, ask to apply for  Patient Assistance Program. Meredeth Ide will ask your household income, and how many people  are in your household. They will quote your out-of-pocket cost based on that information.  [employer] will also be able to set up a 61-month, interest-free payment plan if needed.  Applying the monitor   Shave hair from upper left chest.  Hold abrader disc by orange tab. Rub abrader in 40 strokes over the upper left chest as  indicated in your monitor instructions.  Clean area with 4 enclosed alcohol pads. Let dry.  Apply patch as indicated in monitor instructions. Patch will be placed under collarbone on left  side of chest with arrow pointing upward.  Rub patch adhesive wings for 2 minutes. Remove white label marked "1". Remove the white  label marked "2". Rub patch adhesive wings for 2 additional minutes.  While looking in a mirror, press and release button in center of patch. A small green light will  flash 3-4 times. This will be your only indicator that the monitor has been turned on.  Do not shower for the first 24 hours. You may shower after the first 24 hours.  Press the button if you feel a symptom. You will hear a small click. Record Date, Time and  Symptom in the Patient Logbook.  When you are ready to remove the patch, follow instructions on the last 2 pages of Patient  Logbook. Stick patch monitor onto the last page of Patient Logbook.  Place Patient Logbook in the blue and white box. Use locking tab on box and tape box closed  securely. The blue and white box has prepaid postage on it. Please  place it in the mailbox as  soon as possible. Your physician should have your test results approximately 7 days after the  monitor has been mailed back to [facility].  Call [facility] Customer Care at [phone] if you have questions regarding  your ZIO XT patch monitor. Call them immediately if you see an orange light blinking on your  monitor.  If your monitor falls off in less than 4 days, contact our Monitor department at [phone].  If your monitor becomes loose or falls off after 4 days call [employer] at [phone] for  suggestions on securing your monitor    Signed, Tonny Bollman, MD  04/06/2023 3:41 PM    [facility]

## 2023-04-06 NOTE — Progress Notes (Unsigned)
Applied a 14 day Zio XT monitor to patient in the office ?

## 2023-04-06 NOTE — Patient Instructions (Signed)
Medication Instructions:  Your physician has recommended you make the following change in your medication:  1-Take Diltiazem 30 mg by mouth as need for elevated heart rate, can take again in 1 hour if heart rate does not come down.  *If you need a refill on your cardiac medications before your next appointment, please call your pharmacy*  Lab Work: If you have labs (blood work) drawn today and your tests are completely normal, you will receive your results only by: MyChart Message (if you have MyChart) OR A paper copy in the mail If you have any lab test that is abnormal or we need to change your treatment, we will call you to review the results.  Testing/Procedures: Your physician has requested that you have an echocardiogram. Echocardiography is a painless test that uses sound waves to create images of your heart. It provides your doctor with information about the size and shape of your heart and how well your heart's chambers and valves are working. This procedure takes approximately one hour. There are no restrictions for this procedure. Please do NOT wear cologne, perfume, aftershave, or lotions (deodorant is allowed). Please arrive 15 minutes prior to your appointment time.  Your physician has recommended that you wear a monitor for 2 weeks. The monitors are medical devices that record the heart's electrical activity. Doctors most often [place] these monitors to diagnose arrhythmias. Arrhythmias are problems with the speed or rhythm of the heartbeat. The monitor is a small, portable device. You can wear one while you do your normal daily activities. This is usually used to diagnose what is causing palpitations/syncope (passing out).  Follow-Up: At [facility], you and your health needs are our priority.  As part of our continuing mission to provide you with exceptional heart care, we have created designated Provider Care Teams.  These Care Teams include your primary Cardiologist  (physician) and Advanced Practice Providers (APPs -  Physician Assistants and Nurse Practitioners) who all work together to provide you with the care you need, when you need it.  We recommend signing up for the patient portal called "MyChart".  Sign up information is provided on this After Visit Summary.  MyChart is used to connect with patients for Virtual Visits (Telemedicine).  Patients are able to view lab/test results, encounter notes, upcoming appointments, etc.  Non-urgent messages can be sent to your provider as well.   To learn more about what you can do with MyChart, go to [email or web address].    Your next appointment:   1 year(s)  Provider:   Tonny Bollman, MD     Other Instructions Monica Oconnell- Long Term Monitor Instructions  Your physician has requested you wear a ZIO patch monitor for 14 days.  This is a single patch monitor. [employer] supplies one patch monitor per enrollment. Additional stickers are not available. Please do not apply patch if you will be having a Nuclear Stress Test,  Echocardiogram, Cardiac CT, MRI, or Chest Xray during the period you would be wearing the  monitor. The patch cannot be worn during these tests. You cannot remove and re-apply the  ZIO XT patch monitor.  Your ZIO patch monitor will be mailed 3 day USPS to your address on file. It may take 3-5 days  to receive your monitor after you have been enrolled.  Once you have received your monitor, please review the enclosed instructions. Your monitor  has already been registered assigning a specific monitor serial # to you.  Billing and Patient Assistance  Program Information  We have supplied [employer] with any of your insurance information on file for billing purposes. [employer] offers a sliding scale Patient Assistance Program for patients that do not have  insurance, or whose insurance does not completely cover the cost of the ZIO monitor.  You must apply for the Patient Assistance Program to qualify  for this discounted rate.  To apply, please call [employer] at [phone], select option 4, select option 2, ask to apply for  Patient Assistance Program. Monica Oconnell will ask your household income, and how many people  are in your household. They will quote your out-of-pocket cost based on that information.  [employer] will also be able to set up a 44-month, interest-free payment plan if needed.  Applying the monitor   Shave hair from upper left chest.  Hold abrader disc by orange tab. Rub abrader in 40 strokes over the upper left chest as  indicated in your monitor instructions.  Clean area with 4 enclosed alcohol pads. Let dry.  Apply patch as indicated in monitor instructions. Patch will be placed under collarbone on left  side of chest with arrow pointing upward.  Rub patch adhesive wings for 2 minutes. Remove white label marked "1". Remove the white  label marked "2". Rub patch adhesive wings for 2 additional minutes.  While looking in a mirror, press and release button in center of patch. A small green light will  flash 3-4 times. This will be your only indicator that the monitor has been turned on.  Do not shower for the first 24 hours. You may shower after the first 24 hours.  Press the button if you feel a symptom. You will hear a small click. Record Date, Time and  Symptom in the Patient Logbook.  When you are ready to remove the patch, follow instructions on the last 2 pages of Patient  Logbook. Stick patch monitor onto the last page of Patient Logbook.  Place Patient Logbook in the blue and white box. Use locking tab on box and tape box closed  securely. The blue and white box has prepaid postage on it. Please place it in the mailbox as  soon as possible. Your physician should have your test results approximately 7 days after the  monitor has been mailed back to [facility].  Call [facility] Customer Care at [phone] if you have questions regarding  your ZIO XT patch  monitor. Call them immediately if you see an orange light blinking on your  monitor.  If your monitor falls off in less than 4 days, contact our Monitor department at [phone].  If your monitor becomes loose or falls off after 4 days call [employer] at [phone] for  suggestions on securing your monitor

## 2023-04-12 ENCOUNTER — Ambulatory Visit: Payer: BC Managed Care – PPO | Admitting: Cardiology

## 2023-04-18 ENCOUNTER — Ambulatory Visit: Payer: BC Managed Care – PPO | Admitting: Cardiovascular Disease

## 2023-04-19 ENCOUNTER — Ambulatory Visit (HOSPITAL_COMMUNITY): Payer: BC Managed Care – PPO | Attending: Cardiovascular Disease

## 2023-04-19 DIAGNOSIS — I471 Supraventricular tachycardia, unspecified: Secondary | ICD-10-CM | POA: Insufficient documentation

## 2023-04-19 LAB — ECHOCARDIOGRAM COMPLETE
Area-P 1/2: 2.48 cm2
S' Lateral: 2.6 cm

## 2023-04-23 DIAGNOSIS — I471 Supraventricular tachycardia, unspecified: Secondary | ICD-10-CM | POA: Diagnosis not present

## 2023-04-28 DIAGNOSIS — Z01419 Encounter for gynecological examination (general) (routine) without abnormal findings: Secondary | ICD-10-CM | POA: Diagnosis not present

## 2023-04-28 DIAGNOSIS — Z1231 Encounter for screening mammogram for malignant neoplasm of breast: Secondary | ICD-10-CM | POA: Diagnosis not present

## 2023-05-03 ENCOUNTER — Other Ambulatory Visit: Payer: Self-pay | Admitting: Obstetrics and Gynecology

## 2023-05-03 ENCOUNTER — Encounter: Payer: Self-pay | Admitting: Obstetrics and Gynecology

## 2023-05-03 DIAGNOSIS — R928 Other abnormal and inconclusive findings on diagnostic imaging of breast: Secondary | ICD-10-CM

## 2023-05-24 ENCOUNTER — Other Ambulatory Visit: Payer: Self-pay | Admitting: Obstetrics and Gynecology

## 2023-05-24 ENCOUNTER — Ambulatory Visit
Admission: RE | Admit: 2023-05-24 | Discharge: 2023-05-24 | Disposition: A | Discharge status: Home / Self Care | Payer: BC Managed Care – PPO | Source: Ambulatory Visit | Attending: Obstetrics and Gynecology | Admitting: Obstetrics and Gynecology

## 2023-05-24 DIAGNOSIS — R928 Other abnormal and inconclusive findings on diagnostic imaging of breast: Secondary | ICD-10-CM

## 2023-05-24 DIAGNOSIS — N6321 Unspecified lump in the left breast, upper outer quadrant: Secondary | ICD-10-CM | POA: Diagnosis not present

## 2023-05-24 DIAGNOSIS — N6320 Unspecified lump in the left breast, unspecified quadrant: Secondary | ICD-10-CM

## 2023-05-26 ENCOUNTER — Ambulatory Visit
Admission: RE | Admit: 2023-05-26 | Discharge: 2023-05-26 | Disposition: A | Discharge status: Home / Self Care | Payer: BC Managed Care – PPO | Source: Ambulatory Visit | Attending: Obstetrics and Gynecology | Admitting: Obstetrics and Gynecology

## 2023-05-26 DIAGNOSIS — N6320 Unspecified lump in the left breast, unspecified quadrant: Secondary | ICD-10-CM

## 2023-05-26 DIAGNOSIS — E538 Deficiency of other specified B group vitamins: Secondary | ICD-10-CM | POA: Diagnosis not present

## 2023-05-26 DIAGNOSIS — R7989 Other specified abnormal findings of blood chemistry: Secondary | ICD-10-CM | POA: Diagnosis not present

## 2023-05-26 DIAGNOSIS — R82998 Other abnormal findings in urine: Secondary | ICD-10-CM | POA: Diagnosis not present

## 2023-05-26 DIAGNOSIS — D649 Anemia, unspecified: Secondary | ICD-10-CM | POA: Diagnosis not present

## 2023-05-26 DIAGNOSIS — N6321 Unspecified lump in the left breast, upper outer quadrant: Secondary | ICD-10-CM | POA: Diagnosis not present

## 2023-05-26 DIAGNOSIS — N6012 Diffuse cystic mastopathy of left breast: Secondary | ICD-10-CM | POA: Diagnosis not present

## 2023-05-26 HISTORY — PX: BREAST BIOPSY: SHX20

## 2023-05-27 LAB — SURGICAL PATHOLOGY

## 2023-05-30 DIAGNOSIS — F332 Major depressive disorder, recurrent severe without psychotic features: Secondary | ICD-10-CM | POA: Diagnosis not present

## 2023-06-03 DIAGNOSIS — Z1331 Encounter for screening for depression: Secondary | ICD-10-CM | POA: Diagnosis not present

## 2023-06-03 DIAGNOSIS — Z0000 Encounter for general adult medical examination without abnormal findings: Secondary | ICD-10-CM | POA: Diagnosis not present

## 2023-06-03 DIAGNOSIS — G4700 Insomnia, unspecified: Secondary | ICD-10-CM | POA: Diagnosis not present

## 2023-06-03 DIAGNOSIS — Z1339 Encounter for screening examination for other mental health and behavioral disorders: Secondary | ICD-10-CM | POA: Diagnosis not present

## 2023-06-22 ENCOUNTER — Other Ambulatory Visit: Payer: Self-pay | Admitting: General Surgery

## 2023-06-22 DIAGNOSIS — N6321 Unspecified lump in the left breast, upper outer quadrant: Secondary | ICD-10-CM | POA: Diagnosis not present

## 2023-07-15 ENCOUNTER — Inpatient Hospital Stay (HOSPITAL_COMMUNITY): Admission: RE | Admit: 2023-07-15 | Payer: BC Managed Care – PPO | Source: Ambulatory Visit

## 2023-07-19 NOTE — Pre-Procedure Instructions (Signed)
 Surgical Instructions   Your procedure is scheduled on July 22, 2023. Report to [facility] Main Entrance A at 5:30 A.M., then check in with the Admitting office. Any questions or running late day of surgery: call [phone]  Questions prior to your surgery date: call [phone], Monday-Friday, 8am-4pm. If you experience any cold or flu symptoms such as cough, fever, chills, shortness of breath, etc. between now and your scheduled surgery, please notify us  at the above number.     Remember:  Do not eat after midnight the night before your surgery   You may drink clear liquids until 4:30 AM the morning of your surgery.   Clear liquids allowed are: Water, Non-Citrus Juices (without pulp), Carbonated Beverages, Clear Tea (no milk, honey, etc.), Black Coffee Only (NO MILK, CREAM OR POWDERED CREAMER of any kind), and Gatorade.  Patient Instructions  The night before surgery:  No food after midnight. ONLY clear liquids after midnight  The day of surgery (if you do NOT have diabetes):  Drink ONE (1) Pre-Surgery Clear Ensure by 4:30 AM the morning of surgery. Drink in one sitting. Do not sip.  This drink was given to you during your hospital  pre-op appointment visit.  Nothing else to drink after completing the  Pre-Surgery Clear Ensure.         If you have questions, please contact your surgeon's office.    Take these medicines the morning of surgery with A SIP OF WATER: NONE   May take these medicines IF NEEDED: diltiazem  (CARDIZEM )  hydrOXYzine (ATARAX)    One week prior to surgery, STOP taking any Aspirin (unless otherwise instructed by your surgeon) Aleve, Naproxen, Ibuprofen, Motrin, Advil, Goody's, BC's, all herbal medications, fish oil, and non-prescription vitamins.                     Do NOT Smoke (Tobacco/Vaping) for 24 hours prior to your procedure.  If you use a CPAP at night, you may bring your mask/headgear for your overnight stay.   You will be asked  to remove any contacts, glasses, piercing's, hearing aid's, dentures/partials prior to surgery. Please bring cases for these items if needed.    Patients discharged the day of surgery will not be allowed to drive home, and someone needs to stay with them for 24 hours.  SURGICAL WAITING ROOM VISITATION Patients may have no more than 2 support people in the waiting area - these visitors may rotate.   Pre-op nurse will coordinate an appropriate time for 1 ADULT support person, who may not rotate, to accompany patient in pre-op.  Children under the age of 2 must have an adult with them who is not the patient and must remain in the main waiting area with an adult.  If the patient needs to stay at the hospital during part of their recovery, the visitor guidelines for inpatient rooms apply.  Please refer to the [facility] website for the visitor guidelines for any additional information.   If you received a COVID test during your pre-op visit  it is requested that you wear a mask when out in public, stay away from anyone that may not be feeling well and notify your surgeon if you develop symptoms. If you have been in contact with anyone that has tested positive in the last 10 days please notify you surgeon.      Pre-operative CHG Bathing Instructions   You can play a key role in reducing the risk of infection  after surgery. Your skin needs to be as free of germs as possible. You can reduce the number of germs on your skin by washing with CHG (chlorhexidine  gluconate) soap before surgery. CHG is an antiseptic soap that kills germs and continues to kill germs even after washing.   DO NOT use if you have an allergy to chlorhexidine /CHG or antibacterial soaps. If your skin becomes reddened or irritated, stop using the CHG and notify one of our RNs at [phone].              TAKE A SHOWER THE NIGHT BEFORE SURGERY AND THE DAY OF SURGERY    Please keep in mind the following:  DO NOT shave,  including legs and underarms, 48 hours prior to surgery.   You may shave your face before/day of surgery.  Place clean sheets on your bed the night before surgery Use a clean washcloth (not used since being washed) for each shower. DO NOT sleep with pet's night before surgery.  CHG Shower Instructions:  Wash your face and private area with normal soap. If you choose to wash your hair, wash first with your normal shampoo.  After you use shampoo/soap, rinse your hair and body thoroughly to remove shampoo/soap residue.  Turn the water OFF and apply half the bottle of CHG soap to a CLEAN washcloth.  Apply CHG soap ONLY FROM YOUR NECK DOWN TO YOUR TOES (washing for 3-5 minutes)  DO NOT use CHG soap on face, private areas, open wounds, or sores.  Pay special attention to the area where your surgery is being performed.  If you are having back surgery, having someone wash your back for you may be helpful. Wait 2 minutes after CHG soap is applied, then you may rinse off the CHG soap.  Pat dry with a clean towel  Put on clean pajamas    Additional instructions for the day of surgery: DO NOT APPLY any lotions, deodorants, cologne, or perfumes.   Do not wear jewelry or makeup Do not wear nail polish, gel polish, artificial nails, or any other type of covering on natural nails (fingers and toes) Do not bring valuables to the hospital. [facility] is not responsible for valuables/personal belongings. Put on clean/comfortable clothes.  Please brush your teeth.  Ask your nurse before applying any prescription medications to the skin.

## 2023-07-20 ENCOUNTER — Encounter (HOSPITAL_COMMUNITY): Payer: Self-pay

## 2023-07-20 ENCOUNTER — Other Ambulatory Visit: Payer: Self-pay

## 2023-07-20 ENCOUNTER — Encounter (HOSPITAL_COMMUNITY)
Admission: RE | Admit: 2023-07-20 | Discharge: 2023-07-20 | Disposition: A | Discharge status: Home / Self Care | Payer: BC Managed Care – PPO | Source: Ambulatory Visit | Attending: General Surgery | Admitting: General Surgery

## 2023-07-20 ENCOUNTER — Other Ambulatory Visit: Payer: Self-pay | Admitting: General Surgery

## 2023-07-20 VITALS — BP 104/63 | HR 74 | Temp 97.9°F | Resp 18 | Ht 72.0 in | Wt 146.7 lb

## 2023-07-20 DIAGNOSIS — N6022 Fibroadenosis of left breast: Secondary | ICD-10-CM | POA: Diagnosis not present

## 2023-07-20 DIAGNOSIS — Z01818 Encounter for other preprocedural examination: Secondary | ICD-10-CM

## 2023-07-20 DIAGNOSIS — I2510 Atherosclerotic heart disease of native coronary artery without angina pectoris: Secondary | ICD-10-CM | POA: Insufficient documentation

## 2023-07-20 DIAGNOSIS — Z01812 Encounter for preprocedural laboratory examination: Secondary | ICD-10-CM | POA: Insufficient documentation

## 2023-07-20 DIAGNOSIS — N6082 Other benign mammary dysplasias of left breast: Secondary | ICD-10-CM | POA: Diagnosis not present

## 2023-07-20 HISTORY — DX: Cardiac arrhythmia, unspecified: I49.9

## 2023-07-20 LAB — BASIC METABOLIC PANEL
Anion gap: 8 (ref 5–15)
BUN: 14 mg/dL (ref 6–20)
CO2: 27 mmol/L (ref 22–32)
Calcium: 8.9 mg/dL (ref 8.9–10.3)
Chloride: 101 mmol/L (ref 98–111)
Creatinine, Ser: 0.77 mg/dL (ref 0.44–1.00)
GFR, Estimated: >60 mL/min (ref >60)
Glucose, Bld: 87 mg/dL (ref 70–99)
Potassium: 4.2 mmol/L (ref 3.5–5.1)
Sodium: 136 mmol/L (ref 135–145)

## 2023-07-20 LAB — CBC
HCT: 40.4 % (ref 36.0–46.0)
Hemoglobin: 13.6 g/dL (ref 12.0–15.0)
MCH: 31 pg (ref 26.0–34.0)
MCHC: 33.7 g/dL (ref 30.0–36.0)
MCV: 92 fL (ref 80.0–100.0)
Platelets: 246 10*3/uL (ref 150–400)
RBC: 4.39 MIL/uL (ref 3.87–5.11)
RDW: 11.9 % (ref 11.5–15.5)
WBC: 3.9 10*3/uL — ABNORMAL LOW (ref 4.0–10.5)
nRBC: 0 % (ref 0.0–0.2)

## 2023-07-20 LAB — POCT PREGNANCY, URINE: Preg Test, Ur: NEGATIVE

## 2023-07-20 NOTE — Progress Notes (Signed)
 PCP - Dr. Norleen Jungling Cardiologist - Dr. Ozell Fell - last office visit 04/06/2023  PPM/ICD - Denies Device Orders - n/a Rep Notified - n/a  Chest x-ray - 03/28/2023 EKG - 03/29/2023 Stress Test - Denies ECHO - 04/19/2023 Cardiac Cath - Denies CT Coronary - 12/14/2022  Sleep Study - Denies CPAP - n/a  No DM  Last dose of GLP1 agonist- n/a GLP1 instructions: n/a  Blood Thinner Instructions: n/a Aspirin Instructions: n/a  ERAS Protcol - Clear liquids until 0430 morning of surgery PRE-SURGERY Ensure or G2- Ensure given to pt with instructions  COVID TEST- n/a   Anesthesia review: Yes. Breast seed placement 07/21/2023 at 1430  Patient denies shortness of breath, fever, cough and chest pain at PAT appointment. Pt denies any respiratory illness/infection in the last two months.   All instructions explained to the patient, with a verbal understanding of the material. Patient agrees to go over the instructions while at home for a better understanding. Patient also instructed to self quarantine after being tested for COVID-19. The opportunity to ask questions was provided.

## 2023-07-21 ENCOUNTER — Ambulatory Visit
Admission: RE | Admit: 2023-07-21 | Discharge: 2023-07-21 | Disposition: A | Discharge status: Home / Self Care | Payer: BC Managed Care – PPO | Source: Ambulatory Visit | Attending: General Surgery | Admitting: General Surgery

## 2023-07-21 ENCOUNTER — Other Ambulatory Visit: Payer: Self-pay | Admitting: General Surgery

## 2023-07-21 ENCOUNTER — Encounter: Payer: Self-pay | Admitting: General Surgery

## 2023-07-21 DIAGNOSIS — N6321 Unspecified lump in the left breast, upper outer quadrant: Secondary | ICD-10-CM

## 2023-07-21 HISTORY — PX: BREAST BIOPSY: SHX20

## 2023-07-21 NOTE — H&P (Signed)
 53 yof with screening mm. B density tissue. Found to have left asymmetry. Dx views show presence of focal asymmetry in uoq. Us  shows a 9x5x9 mm mass. Axilla negative. Biopsy is benign breast tissue and read as discordant. She has fh in mgm postmenopausal. No prior breast history.  Prior episode svt seen by cardiology Corlis) and had normal echo and neg monitor. No further f/u was needed  Review of Systems: A complete review of systems was obtained from the patient. I have reviewed this information and discussed as appropriate with the patient. See HPI as well for other ROS.  Review of Systems  All other systems reviewed and are negative.  Medical History: No past medical history on file.  No past surgical history on file.   No current outpatient medications on file prior to visit.   No family history on file.   Social History   Tobacco Use  Smoking Status Not on file  Smokeless Tobacco Not on file   Objective:   Physical Exam Vitals reviewed.  Constitutional:  Appearance: Normal appearance.  Chest:  Breasts: Left: No inverted nipple, mass or nipple discharge.  Lymphadenopathy:  Upper Body:  Left upper body: No axillary adenopathy.  Neurological:  Mental Status: She is alert.   Assessment and Plan:   Right breast mass, discordant radiologic biopsy  Right breast radioactive seed guided excisional biopsy  We discussed the options of observation versus surgery. This has been recommended for surgery due to discordance. We discussed surgery as well as the risks associated with it. Will plan to proceed.

## 2023-07-22 ENCOUNTER — Ambulatory Visit (HOSPITAL_COMMUNITY): Payer: BC Managed Care – PPO | Admitting: Physician Assistant

## 2023-07-22 ENCOUNTER — Encounter (HOSPITAL_COMMUNITY): Payer: Self-pay | Admitting: General Surgery

## 2023-07-22 ENCOUNTER — Ambulatory Visit (HOSPITAL_COMMUNITY)
Admission: RE | Admit: 2023-07-22 | Discharge: 2023-07-22 | Disposition: A | Discharge status: Home / Self Care | Payer: BC Managed Care – PPO | Attending: General Surgery | Admitting: General Surgery

## 2023-07-22 ENCOUNTER — Encounter (HOSPITAL_COMMUNITY)
Admission: RE | Disposition: A | Discharge status: Home / Self Care | Payer: Self-pay | Source: Home / Self Care | Attending: General Surgery

## 2023-07-22 ENCOUNTER — Other Ambulatory Visit: Payer: Self-pay

## 2023-07-22 ENCOUNTER — Ambulatory Visit (HOSPITAL_COMMUNITY): Payer: BC Managed Care – PPO | Admitting: Anesthesiology

## 2023-07-22 ENCOUNTER — Ambulatory Visit
Admission: RE | Admit: 2023-07-22 | Discharge: 2023-07-22 | Disposition: A | Discharge status: Home / Self Care | Payer: BC Managed Care – PPO | Source: Ambulatory Visit | Attending: General Surgery | Admitting: General Surgery

## 2023-07-22 DIAGNOSIS — N6032 Fibrosclerosis of left breast: Secondary | ICD-10-CM | POA: Diagnosis not present

## 2023-07-22 DIAGNOSIS — N6012 Diffuse cystic mastopathy of left breast: Secondary | ICD-10-CM | POA: Diagnosis not present

## 2023-07-22 DIAGNOSIS — N6082 Other benign mammary dysplasias of left breast: Secondary | ICD-10-CM | POA: Insufficient documentation

## 2023-07-22 DIAGNOSIS — N6321 Unspecified lump in the left breast, upper outer quadrant: Secondary | ICD-10-CM

## 2023-07-22 DIAGNOSIS — N6320 Unspecified lump in the left breast, unspecified quadrant: Secondary | ICD-10-CM | POA: Diagnosis not present

## 2023-07-22 DIAGNOSIS — N6022 Fibroadenosis of left breast: Secondary | ICD-10-CM | POA: Diagnosis not present

## 2023-07-22 HISTORY — PX: RADIOACTIVE SEED GUIDED EXCISIONAL BREAST BIOPSY: SHX6490

## 2023-07-22 SURGERY — RADIOACTIVE SEED GUIDED BREAST BIOPSY
Anesthesia: General | Laterality: Left

## 2023-07-22 MED ORDER — PROPOFOL 10 MG/ML IV BOLUS
INTRAVENOUS | Status: AC
  Filled 2023-07-22: qty 20

## 2023-07-22 MED ORDER — SODIUM CHLORIDE 0.9% FLUSH: 3.0000 mL | Freq: Two times a day (BID) | INTRAVENOUS | Status: DC

## 2023-07-22 MED ORDER — PROPOFOL 10 MG/ML IV BOLUS
INTRAVENOUS | Status: DC | PRN
  Administered 2023-07-22: 50 mg via INTRAVENOUS
  Administered 2023-07-22: 100 mg via INTRAVENOUS

## 2023-07-22 MED ORDER — CHLORHEXIDINE GLUCONATE CLOTH 2 % EX PADS: 6.0000 | MEDICATED_PAD | Freq: Once | CUTANEOUS | Status: DC

## 2023-07-22 MED ORDER — DEXAMETHASONE SODIUM PHOSPHATE 10 MG/ML IJ SOLN
INTRAMUSCULAR | Status: AC
  Filled 2023-07-22: qty 1

## 2023-07-22 MED ORDER — LIDOCAINE 2% (20 MG/ML) 5 ML SYRINGE
INTRAMUSCULAR | Status: DC | PRN
  Administered 2023-07-22: 40 mg via INTRAVENOUS

## 2023-07-22 MED ORDER — FENTANYL CITRATE (PF) 250 MCG/5ML IJ SOLN
INTRAMUSCULAR | Status: DC | PRN
  Administered 2023-07-22: 25 ug via INTRAVENOUS

## 2023-07-22 MED ORDER — ONDANSETRON HCL 4 MG/2ML IJ SOLN: 4.0000 mg | Freq: Once | INTRAMUSCULAR | Status: DC | PRN

## 2023-07-22 MED ORDER — CEFAZOLIN SODIUM-DEXTROSE 2-4 GM/100ML-% IV SOLN
2.0000 g | INTRAVENOUS | Status: AC
  Administered 2023-07-22: 2 g via INTRAVENOUS

## 2023-07-22 MED ORDER — ACETAMINOPHEN 10 MG/ML IV SOLN
1000.0000 mg | Freq: Once | INTRAVENOUS | Status: DC | PRN
Start: 2023-07-22 — End: 2023-07-22

## 2023-07-22 MED ORDER — FENTANYL CITRATE (PF) 100 MCG/2ML IJ SOLN: 25.0000 ug | INTRAMUSCULAR | Status: DC | PRN

## 2023-07-22 MED ORDER — ACETAMINOPHEN 500 MG PO TABS: 1000.0000 mg | ORAL_TABLET | ORAL | Status: AC

## 2023-07-22 MED ORDER — MIDAZOLAM HCL 2 MG/2ML IJ SOLN
INTRAMUSCULAR | Status: DC | PRN
  Administered 2023-07-22 (×2): 1 mg via INTRAVENOUS

## 2023-07-22 MED ORDER — OXYCODONE HCL 5 MG PO TABS: 5.0000 mg | ORAL_TABLET | Freq: Once | ORAL | Status: DC | PRN

## 2023-07-22 MED ORDER — ORAL CARE MOUTH RINSE: 15.0000 mL | Freq: Once | OROMUCOSAL | Status: AC

## 2023-07-22 MED ORDER — ACETAMINOPHEN 325 MG PO TABS: 650.0000 mg | ORAL_TABLET | ORAL | Status: DC | PRN

## 2023-07-22 MED ORDER — ACETAMINOPHEN 650 MG RE SUPP: 650.0000 mg | RECTAL | Status: DC | PRN

## 2023-07-22 MED ORDER — 0.9 % SODIUM CHLORIDE (POUR BTL) OPTIME
TOPICAL | Status: DC | PRN
  Administered 2023-07-22: 1000 mL

## 2023-07-22 MED ORDER — PHENYLEPHRINE 80 MCG/ML (10ML) SYRINGE FOR IV PUSH (FOR BLOOD PRESSURE SUPPORT)
PREFILLED_SYRINGE | INTRAVENOUS | Status: AC
  Filled 2023-07-22: qty 10

## 2023-07-22 MED ORDER — SODIUM CHLORIDE 0.9 % IV SOLN: 250.0000 mL | INTRAVENOUS | Status: DC | PRN

## 2023-07-22 MED ORDER — SODIUM CHLORIDE 0.9% FLUSH: 3.0000 mL | INTRAVENOUS | Status: DC | PRN

## 2023-07-22 MED ORDER — BUPIVACAINE-EPINEPHRINE (PF) 0.25% -1:200000 IJ SOLN
INTRAMUSCULAR | Status: AC
  Filled 2023-07-22: qty 30

## 2023-07-22 MED ORDER — LACTATED RINGERS IV SOLN: INTRAVENOUS | Status: DC

## 2023-07-22 MED ORDER — OXYCODONE HCL 5 MG/5ML PO SOLN: 5.0000 mg | Freq: Once | ORAL | Status: DC | PRN

## 2023-07-22 MED ORDER — PHENYLEPHRINE 80 MCG/ML (10ML) SYRINGE FOR IV PUSH (FOR BLOOD PRESSURE SUPPORT)
PREFILLED_SYRINGE | INTRAVENOUS | Status: DC | PRN
  Administered 2023-07-22 (×2): 40 ug via INTRAVENOUS

## 2023-07-22 MED ORDER — ONDANSETRON HCL 4 MG/2ML IJ SOLN
INTRAMUSCULAR | Status: AC
  Filled 2023-07-22: qty 2

## 2023-07-22 MED ORDER — MIDAZOLAM HCL 2 MG/2ML IJ SOLN
INTRAMUSCULAR | Status: AC
Start: 2023-07-22 — End: ?
  Filled 2023-07-22: qty 2

## 2023-07-22 MED ORDER — CHLORHEXIDINE GLUCONATE 0.12 % MT SOLN: 15.0000 mL | Freq: Once | OROMUCOSAL | Status: AC

## 2023-07-22 MED ORDER — CEFAZOLIN SODIUM-DEXTROSE 2-4 GM/100ML-% IV SOLN
INTRAVENOUS | Status: AC
  Filled 2023-07-22: qty 100

## 2023-07-22 MED ORDER — ACETAMINOPHEN 500 MG PO TABS
ORAL_TABLET | ORAL | Status: AC
  Administered 2023-07-22: 1000 mg via ORAL
  Filled 2023-07-22: qty 2

## 2023-07-22 MED ORDER — ONDANSETRON HCL 4 MG/2ML IJ SOLN
INTRAMUSCULAR | Status: DC | PRN
  Administered 2023-07-22: 4 mg via INTRAVENOUS

## 2023-07-22 MED ORDER — DEXAMETHASONE SODIUM PHOSPHATE 10 MG/ML IJ SOLN
INTRAMUSCULAR | Status: DC | PRN
  Administered 2023-07-22: 4 mg via INTRAVENOUS

## 2023-07-22 MED ORDER — OXYCODONE HCL 5 MG PO TABS: 5.0000 mg | ORAL_TABLET | ORAL | Status: DC | PRN

## 2023-07-22 MED ORDER — LIDOCAINE 2% (20 MG/ML) 5 ML SYRINGE
INTRAMUSCULAR | Status: AC
  Filled 2023-07-22: qty 5

## 2023-07-22 MED ORDER — LACTATED RINGERS IV SOLN: INTRAVENOUS | Status: DC | PRN

## 2023-07-22 MED ORDER — ENSURE PRE-SURGERY PO LIQD: 296.0000 mL | Freq: Once | ORAL | Status: DC

## 2023-07-22 MED ORDER — BUPIVACAINE-EPINEPHRINE 0.25% -1:200000 IJ SOLN
INTRAMUSCULAR | Status: DC | PRN
  Administered 2023-07-22: 10 mL

## 2023-07-22 MED ORDER — CHLORHEXIDINE GLUCONATE 0.12 % MT SOLN
OROMUCOSAL | Status: AC
  Administered 2023-07-22: 15 mL via OROMUCOSAL
  Filled 2023-07-22: qty 15

## 2023-07-22 MED ORDER — FENTANYL CITRATE (PF) 250 MCG/5ML IJ SOLN
INTRAMUSCULAR | Status: AC
  Filled 2023-07-22: qty 5

## 2023-07-22 SURGICAL SUPPLY — 35 items
APPLIER CLIP 9.375 MED OPEN (MISCELLANEOUS)
BAG COUNTER SPONGE SURGICOUNT (BAG) ×1 IMPLANT
BINDER BREAST LRG (GAUZE/BANDAGES/DRESSINGS) IMPLANT
BINDER BREAST XLRG (GAUZE/BANDAGES/DRESSINGS) IMPLANT
CANISTER SUCT 3000ML PPV (MISCELLANEOUS) ×1 IMPLANT
CHLORAPREP W/TINT 26 (MISCELLANEOUS) ×1 IMPLANT
CLIP APPLIE 9.375 MED OPEN (MISCELLANEOUS) IMPLANT
CLIP TI MEDIUM 6 (CLIP) ×1 IMPLANT
COVER PROBE W GEL 5X96 (DRAPES) ×1 IMPLANT
COVER SURGICAL LIGHT HANDLE (MISCELLANEOUS) ×1 IMPLANT
DERMABOND ADVANCED .7 DNX12 (GAUZE/BANDAGES/DRESSINGS) ×1 IMPLANT
DEVICE DUBIN SPECIMEN MAMMOGRA (MISCELLANEOUS) ×1 IMPLANT
DRAPE CHEST BREAST 15X10 FENES (DRAPES) ×1 IMPLANT
ELECT COATED BLADE 2.86 ST (ELECTRODE) ×1 IMPLANT
ELECT REM PT RETURN 9FT ADLT (ELECTROSURGICAL) ×1
ELECTRODE REM PT RTRN 9FT ADLT (ELECTROSURGICAL) ×1 IMPLANT
GLOVE BIO SURGEON STRL SZ7 (GLOVE) ×2 IMPLANT
GLOVE BIOGEL PI IND STRL 7.5 (GLOVE) ×1 IMPLANT
GOWN STRL REUS W/ TWL LRG LVL3 (GOWN DISPOSABLE) ×2 IMPLANT
KIT BASIN OR (CUSTOM PROCEDURE TRAY) ×1 IMPLANT
KIT MARKER MARGIN INK (KITS) ×1 IMPLANT
LIGHT WAVEGUIDE WIDE FLAT (MISCELLANEOUS) IMPLANT
NDL HYPO 25GX1X1/2 BEV (NEEDLE) ×1 IMPLANT
NEEDLE HYPO 25GX1X1/2 BEV (NEEDLE) ×1
NS IRRIG 1000ML POUR BTL (IV SOLUTION) ×1 IMPLANT
PACK GENERAL/GYN (CUSTOM PROCEDURE TRAY) ×1 IMPLANT
STRIP CLOSURE SKIN 1/2X4 (GAUZE/BANDAGES/DRESSINGS) ×1 IMPLANT
SUT MNCRL AB 4-0 PS2 18 (SUTURE) ×1 IMPLANT
SUT MON AB 5-0 PS2 18 (SUTURE) IMPLANT
SUT SILK 2 0 SH (SUTURE) IMPLANT
SUT VIC AB 2-0 SH 27XBRD (SUTURE) ×1 IMPLANT
SUT VIC AB 3-0 SH 27X BRD (SUTURE) ×1 IMPLANT
SYR CONTROL 10ML LL (SYRINGE) ×1 IMPLANT
TOWEL GREEN STERILE (TOWEL DISPOSABLE) ×1 IMPLANT
TOWEL GREEN STERILE FF (TOWEL DISPOSABLE) ×1 IMPLANT

## 2023-07-22 NOTE — Op Note (Signed)
 Preoperative diagnosis: Left breast mass with discordant core biopsy Postoperative diagnosis: Same as above Procedure: Left breast radioactive seed guided excisional biopsy Surgeon: Dr. Adina Bury Specimens: left breast tissue containing seed and clip Anesthesia: General Complications: None Drains: None Estimated blood loss: Minimal Sponge needle count was correct completion Disposition recovery stable condition     Indications: 59 yof with screening mm. B density tissue. Found to have left asymmetry. Dx views show presence of focal asymmetry in uoq. Us  shows a 9x5x9 mm mass. Axilla negative. Biopsy is benign breast tissue and read as discordant. We discussed excisional biopsy.        Procedure: After informed consent was obtained patient was taken to the operating room.  She was given antibiotics.  SCDs were in place.  She was placed under general anesthesia without complication.  She was prepped and draped in a standard sterile surgical fashion.   The seed was found in the UOQ and was very close to the skin.   I infiltrated Marcaine  throughout this area.  I then made a curvilinear incision overlying the seed due to proximity to the skin.   I used the neoprobe to guide the excision of the seed in the surrounding tissue.  Mammogram confirmed removal of the seed and clip.  I then obtained hemostasis.  I closed the breast tissue with 2-0 Vicryl.  The skin was closed with 3-0 Vicryl and 5-0 Monocryl.  Glue and Steri-Strips were applied.  She tolerated this well was extubated and transferred recovery stable.

## 2023-07-22 NOTE — Interval H&P Note (Signed)
 History and Physical Interval Note:  07/22/2023 7:07 AM  Monica Oconnell Alert  has presented today for surgery, with the diagnosis of LEFT BREAST MASS.  The various methods of treatment have been discussed with the patient and family. After consideration of risks, benefits and other options for treatment, the patient has consented to  Procedure(s) with comments: LEFT BREAST SEED GUIDED EXCISIONAL BIOPSY (Left) - LMA as a surgical intervention.  The patient's history has been reviewed, patient examined, no change in status, stable for surgery.  I have reviewed the patient's chart and labs.  Questions were answered to the patient's satisfaction.     Monica Oconnell

## 2023-07-22 NOTE — Anesthesia Procedure Notes (Signed)
 Procedure Name: LMA Insertion Date/Time: 07/22/2023 7:36 AM  Performed by: Rhodia Debby MATSU, CRNAPre-anesthesia Checklist: Patient identified, Emergency Drugs available, Suction available, Patient being monitored and Timeout performed Patient Re-evaluated:Patient Re-evaluated prior to induction Oxygen Delivery Method: Circle system utilized Preoxygenation: Pre-oxygenation with 100% oxygen Induction Type: IV induction Ventilation: Mask ventilation without difficulty LMA: LMA inserted LMA Size: 4.0 Number of attempts: 1 (brief atraumatic dentition unchanged) Placement Confirmation: positive ETCO2 and breath sounds checked- equal and bilateral ETT to lip (cm): yes. Tube secured with: Tape Dental Injury: Teeth and Oropharynx as per pre-operative assessment

## 2023-07-22 NOTE — Transfer of Care (Signed)
 Immediate Anesthesia Transfer of Care Note  Patient: Monica Oconnell  Procedure(s) Performed: LEFT BREAST SEED GUIDED EXCISIONAL BIOPSY (Left)  Patient Location: PACU  Anesthesia Type:General  Level of Consciousness: awake, alert , oriented, and patient cooperative  Airway & Oxygen Therapy: Patient Spontanous Breathing and Patient connected to nasal cannula oxygen  Post-op Assessment: Report given to RN and Post -op Vital signs reviewed and stable  Post vital signs: Reviewed and stable  Last Vitals:  Vitals Value Taken Time  BP 97/65 07/22/23 0813  Temp    Pulse    Resp 10 07/22/23 0815  SpO2    Vitals shown include unfiled device data.  Last Pain:  Vitals:   07/22/23 0609  PainSc: 0-No pain         Complications: No notable events documented.

## 2023-07-22 NOTE — Anesthesia Preprocedure Evaluation (Signed)
 Anesthesia Evaluation  Patient identified by MRN, date of birth, ID band Patient awake    Reviewed: Allergy & Precautions, NPO status , Patient's Chart, lab work & pertinent test results, reviewed documented beta blocker date and time   History of Anesthesia Complications Negative for: history of anesthetic complications  Airway Mallampati: II  TM Distance: >3 FB     Dental no notable dental hx.    Pulmonary neg COPD, neg PE   breath sounds clear to auscultation       Cardiovascular (-) hypertension(-) angina + dysrhythmias Supra Ventricular Tachycardia  Rhythm:Regular Rate:Normal     Neuro/Psych neg Seizures  Anxiety        GI/Hepatic ,neg GERD  ,,(+) neg Cirrhosis        Endo/Other    Renal/GU Renal disease     Musculoskeletal   Abdominal   Peds  Hematology   Anesthesia Other Findings   Reproductive/Obstetrics                             Anesthesia Physical Anesthesia Plan  ASA: 2  Anesthesia Plan: General   Post-op Pain Management:    Induction: Intravenous  PONV Risk Score and Plan: 2 and Ondansetron   Airway Management Planned: LMA  Additional Equipment:   Intra-op Plan:   Post-operative Plan: Extubation in OR  Informed Consent: I have reviewed the patients History and Physical, chart, labs and discussed the procedure including the risks, benefits and alternatives for the proposed anesthesia with the patient or authorized representative who has indicated his/her understanding and acceptance.     Dental advisory given  Plan Discussed with: CRNA  Anesthesia Plan Comments:         Anesthesia Quick Evaluation

## 2023-07-22 NOTE — Discharge Instructions (Signed)
 [facility] Office Phone Number [phone]  POST OP INSTRUCTIONS Take 400 mg of ibuprofen every 8 hours or 650 mg tylenol  every 6 hours for next 72 hours then as needed. Use ice several times daily also.   Take your usually prescribed medications unless otherwise directed You should eat very light the first 24 hours after surgery, such as soup, crackers, pudding, etc.  Resume your normal diet the day after surgery. Most patients will experience some swelling and bruising in the breast.  Ice packs and a good support bra will help.  Wear the a sports bra for 72 hours day and night.  After that wear a sports bra during the day until you return to the office. Swelling and bruising can take several days to resolve.  It is common to experience some constipation if taking pain medication after surgery.  Increasing fluid intake and taking a stool softener will usually help or prevent this problem from occurring.  A mild laxative (Milk of Magnesia or Miralax) should be taken according to package directions if there are no bowel movements after 48 hours. I used skin glue on the incision, you may shower in 24 hours.  The glue will flake off over the next 2-3 weeks.  Any sutures or staples will be removed at the office during your follow-up visit. ACTIVITIES:  You may resume regular daily activities (gradually increasing) beginning the next day.  Wearing a good support bra or sports bra minimizes pain and swelling.  You may have sexual intercourse when it is comfortable. You may drive when you no longer are taking prescription pain medication, you can comfortably wear a seatbelt, and you can safely maneuver your car and apply brakes. RETURN TO WORK:  ______________________________________________________________________________________ Monica Oconnell should see your doctor in the office for a follow-up appointment approximately two weeks after your surgery.  Your doctor's nurse will typically make your  follow-up appointment when she calls you with your pathology report.  Expect your pathology report 3-4 business days after your surgery.  You may call to check if you do not hear from us  after three days. OTHER INSTRUCTIONS: _______________________________________________________________________________________________ _____________________________________________________________________________________________________________________________________ _____________________________________________________________________________________________________________________________________ _____________________________________________________________________________________________________________________________________  WHEN TO CALL DR Suman Trivedi: Fever over 101.0 Nausea and/or vomiting. Extreme swelling or bruising. Continued bleeding from incision. Increased pain, redness, or drainage from the incision.  The clinic staff is available to answer your questions during regular business hours.  Please don't hesitate to call and ask to speak to one of the nurses for clinical concerns.  If you have a medical emergency, go to the nearest emergency room or call 911.  A surgeon from [facility] is always on call at the hospital.  For further questions, please visit [email or web address]

## 2023-07-22 NOTE — Anesthesia Postprocedure Evaluation (Signed)
 Anesthesia Post Note  Patient: Monica Oconnell  Procedure(s) Performed: LEFT BREAST SEED GUIDED EXCISIONAL BIOPSY (Left)     Patient location during evaluation: PACU Anesthesia Type: General Level of consciousness: awake and alert Pain management: pain level controlled Vital Signs Assessment: post-procedure vital signs reviewed and stable Respiratory status: spontaneous breathing, nonlabored ventilation, respiratory function stable and patient connected to nasal cannula oxygen Cardiovascular status: blood pressure returned to baseline and stable Postop Assessment: no apparent nausea or vomiting Anesthetic complications: no   No notable events documented.  Last Vitals:  Vitals:   07/22/23 0815 07/22/23 0845  BP: 103/70   Pulse:    Resp: 10   Temp: 36.6 C 36.6 C  SpO2: 100%     Last Pain:  Vitals:   07/22/23 0845  PainSc: 0-No pain                 Lynwood MARLA Cornea

## 2023-07-23 ENCOUNTER — Encounter (HOSPITAL_COMMUNITY): Payer: Self-pay | Admitting: General Surgery

## 2023-07-25 LAB — SURGICAL PATHOLOGY

## 2023-08-09 DIAGNOSIS — D2221 Melanocytic nevi of right ear and external auricular canal: Secondary | ICD-10-CM | POA: Diagnosis not present

## 2023-08-09 DIAGNOSIS — D22122 Melanocytic nevi of left lower eyelid, including canthus: Secondary | ICD-10-CM | POA: Diagnosis not present

## 2023-08-09 DIAGNOSIS — D2262 Melanocytic nevi of left upper limb, including shoulder: Secondary | ICD-10-CM | POA: Diagnosis not present

## 2023-08-09 DIAGNOSIS — D224 Melanocytic nevi of scalp and neck: Secondary | ICD-10-CM | POA: Diagnosis not present

## 2023-08-23 DIAGNOSIS — F332 Major depressive disorder, recurrent severe without psychotic features: Secondary | ICD-10-CM | POA: Diagnosis not present

## 2023-11-07 DIAGNOSIS — F332 Major depressive disorder, recurrent severe without psychotic features: Secondary | ICD-10-CM | POA: Diagnosis not present

## 2023-11-18 DIAGNOSIS — M79641 Pain in right hand: Secondary | ICD-10-CM | POA: Diagnosis not present

## 2023-11-18 DIAGNOSIS — M79642 Pain in left hand: Secondary | ICD-10-CM | POA: Diagnosis not present

## 2023-12-16 DIAGNOSIS — R3915 Urgency of urination: Secondary | ICD-10-CM | POA: Diagnosis not present

## 2023-12-16 DIAGNOSIS — R350 Frequency of micturition: Secondary | ICD-10-CM | POA: Diagnosis not present

## 2023-12-16 DIAGNOSIS — R351 Nocturia: Secondary | ICD-10-CM | POA: Diagnosis not present

## 2024-01-05 DIAGNOSIS — R350 Frequency of micturition: Secondary | ICD-10-CM | POA: Diagnosis not present

## 2024-02-21 DIAGNOSIS — F332 Major depressive disorder, recurrent severe without psychotic features: Secondary | ICD-10-CM | POA: Diagnosis not present

## 2024-03-05 ENCOUNTER — Ambulatory Visit (INDEPENDENT_AMBULATORY_CARE_PROVIDER_SITE_OTHER): Admitting: Podiatry

## 2024-03-05 ENCOUNTER — Ambulatory Visit (INDEPENDENT_AMBULATORY_CARE_PROVIDER_SITE_OTHER)

## 2024-03-05 ENCOUNTER — Encounter: Payer: Self-pay | Admitting: Podiatry

## 2024-03-05 VITALS — Ht 72.0 in | Wt 143.0 lb

## 2024-03-05 DIAGNOSIS — M722 Plantar fascial fibromatosis: Secondary | ICD-10-CM | POA: Diagnosis not present

## 2024-03-05 DIAGNOSIS — M205X1 Other deformities of toe(s) (acquired), right foot: Secondary | ICD-10-CM | POA: Diagnosis not present

## 2024-03-05 DIAGNOSIS — M7751 Other enthesopathy of right foot: Secondary | ICD-10-CM | POA: Diagnosis not present

## 2024-03-05 MED ORDER — TRIAMCINOLONE ACETONIDE 10 MG/ML IJ SUSP
10.0000 mg | Freq: Once | INTRAMUSCULAR | Status: AC
  Administered 2024-03-05: 10 mg via INTRA_ARTICULAR

## 2024-03-07 NOTE — Progress Notes (Signed)
 Subjective:   Patient ID: Monica Oconnell Alert, female   DOB: 53 y.o.   MRN: [id number]   HPI Patient states she is developed a lot of pain in her big toe joint right and it has been really bothering her and has been going on for at least a year she does yoga and has difficulty with stretching the toe but has pain every day now with walking or ambulation or standing.  Patient does not smoke likes to be active   Review of Systems  All other systems reviewed and are negative.       Objective:  Physical Exam Vitals and nursing note reviewed.  Constitutional:      Appearance: She is well-developed.  Pulmonary:     Effort: Pulmonary effort is normal.  Musculoskeletal:        General: Normal range of motion.  Skin:    General: Skin is warm.  Neurological:     Mental Status: She is alert.     Neurovascular status intact muscle strength found to be adequate range of motion adequate subtalar midtarsal joint with reduced range of motion first MPJ right with minimal crepitus and good excursion of the joint with pain and fluid within the joint itself.  Patient has good digital perfusion well-oriented x 3     Assessment:  Inflammatory condition first MPJ right with hallux limitus and probability for moderate arthritic condition     Plan:  H&P reviewed and at this point I went ahead and I did a periarticular injection around the joint 3 mg Dexasone Kenalog  5 mg Xylocaine  and applied sterile dressing.  I did discuss condition I reviewed the consideration for osteotomy versus fusion and part of it will depend on the condition of the joint and it is difficult to make complete determination now but I would be hopeful that we could perform osteotomy type surgery  X-rays indicate there is narrowing of the joint surface there is an rounded appearance but it does have quite a bit of elevation dorsal spur formation and mild spurring of the hallux itself

## 2024-03-16 DIAGNOSIS — R350 Frequency of micturition: Secondary | ICD-10-CM | POA: Diagnosis not present

## 2024-03-16 DIAGNOSIS — R351 Nocturia: Secondary | ICD-10-CM | POA: Diagnosis not present

## 2024-03-16 DIAGNOSIS — R3915 Urgency of urination: Secondary | ICD-10-CM | POA: Diagnosis not present

## 2024-04-12 ENCOUNTER — Ambulatory Visit: Admitting: Podiatry

## 2024-04-16 DIAGNOSIS — F332 Major depressive disorder, recurrent severe without psychotic features: Secondary | ICD-10-CM | POA: Diagnosis not present

## 2024-04-23 DIAGNOSIS — N3281 Overactive bladder: Secondary | ICD-10-CM | POA: Diagnosis not present

## 2024-04-26 ENCOUNTER — Ambulatory Visit: Admitting: Podiatry

## 2024-04-26 ENCOUNTER — Encounter: Payer: Self-pay | Admitting: Podiatry

## 2024-04-26 DIAGNOSIS — M205X1 Other deformities of toe(s) (acquired), right foot: Secondary | ICD-10-CM

## 2024-04-26 DIAGNOSIS — M7752 Other enthesopathy of left foot: Secondary | ICD-10-CM

## 2024-04-26 DIAGNOSIS — M7751 Other enthesopathy of right foot: Secondary | ICD-10-CM | POA: Diagnosis not present

## 2024-04-26 NOTE — Progress Notes (Signed)
 LM for patient to please call and or stop in for orthotic casting as her convenience per Dr Magdalen   Lolita Schultze Cped

## 2024-04-27 NOTE — Progress Notes (Signed)
 Subjective:   Patient ID: Monica Oconnell Alert, female   DOB: 53 y.o.   MRN: [id number]   HPI Patient presents stating that she would like to do surgery next year for the big toe joint that she has gotten about 60% improvement but knows it needs to be fixed neuro   ROS      Objective:  Physical Exam  Vascular status intact with reduced range of motion of the first MPJ right minimal discomfort currently in the joint but does have moderate arthritis which will gradually get worse over time and does have elevated elongated first metatarsal segment     Assessment:  Hallux limitus condition right with inflammation around the joint surface     Plan:  H&P reviewed I do think this will require an osteotomy biplanar and I reviewed that with the patient and explained to her.  At this point continue rigid bottom shoes and we will have her casted for functional orthotics with reverse Morton's extension to try to slow the process down until it can be done next May and also hopefully keep the left 1 from getting worse.  All questions answered and she will see pedorthist for this to be done

## 2024-04-30 ENCOUNTER — Telehealth: Payer: Self-pay | Admitting: Podiatry

## 2024-04-30 NOTE — Telephone Encounter (Signed)
 Called patient in reference to confirming surgery date. Left message for patient to contact me back.

## 2024-05-02 DIAGNOSIS — M1811 Unilateral primary osteoarthritis of first carpometacarpal joint, right hand: Secondary | ICD-10-CM | POA: Insufficient documentation

## 2024-05-03 DIAGNOSIS — M1811 Unilateral primary osteoarthritis of first carpometacarpal joint, right hand: Secondary | ICD-10-CM | POA: Diagnosis not present

## 2024-05-08 ENCOUNTER — Other Ambulatory Visit

## 2024-05-08 DIAGNOSIS — R351 Nocturia: Secondary | ICD-10-CM | POA: Diagnosis not present

## 2024-05-08 DIAGNOSIS — R350 Frequency of micturition: Secondary | ICD-10-CM | POA: Diagnosis not present

## 2024-05-08 DIAGNOSIS — R3915 Urgency of urination: Secondary | ICD-10-CM | POA: Diagnosis not present

## 2024-05-09 DIAGNOSIS — R350 Frequency of micturition: Secondary | ICD-10-CM | POA: Diagnosis not present

## 2024-05-10 DIAGNOSIS — Z01419 Encounter for gynecological examination (general) (routine) without abnormal findings: Secondary | ICD-10-CM | POA: Diagnosis not present

## 2024-05-10 DIAGNOSIS — Z130 Encounter for screening for diseases of the blood and blood-forming organs and certain disorders involving the immune mechanism: Secondary | ICD-10-CM | POA: Diagnosis not present

## 2024-05-10 DIAGNOSIS — Z1231 Encounter for screening mammogram for malignant neoplasm of breast: Secondary | ICD-10-CM | POA: Diagnosis not present

## 2024-05-24 ENCOUNTER — Ambulatory Visit

## 2024-05-24 NOTE — Progress Notes (Signed)
 Orthotics   Patient was present and evaluated for Custom molded foot orthotics. Patient will benefit from CFO's to provide total contact to BIL MLA's helping to balance and distribute body weight more evenly across BIL feet helping to reduce plantar pressure and pain. Orthotic will also encourage FF / RF alignment  Patient was scanned today and will return for fitting upon receipt Monica Oconnell Cped

## 2024-06-06 DIAGNOSIS — E538 Deficiency of other specified B group vitamins: Secondary | ICD-10-CM | POA: Diagnosis not present

## 2024-06-06 DIAGNOSIS — D649 Anemia, unspecified: Secondary | ICD-10-CM | POA: Diagnosis not present

## 2024-06-06 DIAGNOSIS — Z1212 Encounter for screening for malignant neoplasm of rectum: Secondary | ICD-10-CM | POA: Diagnosis not present

## 2024-06-18 ENCOUNTER — Telehealth: Payer: Self-pay

## 2024-06-18 NOTE — Telephone Encounter (Signed)
 Orthotics are in. Balance is $480.68-[employer] patient

## 2024-06-19 NOTE — Telephone Encounter (Signed)
 Called patient to schedule an appointment to [facility]. Left a VM

## 2024-06-22 DIAGNOSIS — Z1331 Encounter for screening for depression: Secondary | ICD-10-CM | POA: Diagnosis not present

## 2024-06-22 DIAGNOSIS — Z1389 Encounter for screening for other disorder: Secondary | ICD-10-CM | POA: Diagnosis not present

## 2024-06-22 DIAGNOSIS — Z0000 Encounter for general adult medical examination without abnormal findings: Secondary | ICD-10-CM | POA: Diagnosis not present

## 2024-06-22 DIAGNOSIS — G4700 Insomnia, unspecified: Secondary | ICD-10-CM | POA: Diagnosis not present

## 2024-06-27 DIAGNOSIS — F332 Major depressive disorder, recurrent severe without psychotic features: Secondary | ICD-10-CM | POA: Diagnosis not present

## 2024-06-29 ENCOUNTER — Ambulatory Visit (INDEPENDENT_AMBULATORY_CARE_PROVIDER_SITE_OTHER): Admitting: Podiatry

## 2024-06-29 DIAGNOSIS — M722 Plantar fascial fibromatosis: Secondary | ICD-10-CM

## 2024-06-29 DIAGNOSIS — M205X1 Other deformities of toe(s) (acquired), right foot: Secondary | ICD-10-CM

## 2024-06-29 DIAGNOSIS — M7751 Other enthesopathy of right foot: Secondary | ICD-10-CM

## 2024-06-29 NOTE — Progress Notes (Unsigned)
 Orthotics were dispensed and functioning well and no acute complaints.  Break-in period was discussed.  If any foot and ankle issues on future she will come back and see me.

## 2024-08-10 ENCOUNTER — Encounter: Payer: Self-pay | Admitting: Podiatry

## 2024-08-10 ENCOUNTER — Ambulatory Visit: Admitting: Podiatry

## 2024-08-10 ENCOUNTER — Ambulatory Visit (INDEPENDENT_AMBULATORY_CARE_PROVIDER_SITE_OTHER)

## 2024-08-10 DIAGNOSIS — M205X1 Other deformities of toe(s) (acquired), right foot: Secondary | ICD-10-CM

## 2024-08-10 DIAGNOSIS — N63 Unspecified lump in unspecified breast: Secondary | ICD-10-CM | POA: Insufficient documentation

## 2024-08-10 NOTE — Patient Instructions (Signed)

## 2024-08-10 NOTE — Progress Notes (Signed)
 Subjective:   Patient ID: Monica Oconnell Alert, female   DOB: 54 y.o.   MRN: [id number]   HPI Patient presents stating that she is ready for surgery which is going to be done in 2 weeks   ROS      Objective:  Physical Exam  Neurovascular status intact range of motion of the first MPJ is good minimal crepitus noted with pain in joint with motion and spur formation     Assessment:  Chronic hallux limitus deformity right with structure     Plan:  H&P reviewed and at this point I allowed her to read consent form for biplanar osteotomy right first metatarsal with removal of bone spurs.  I did explain there is no guarantee as to the condition of the joint and it is possible that there will be cartilage damage but I am very hopeful given the amount of motion she has preoperatively that we will be able to reposition the bone and fix this.  Ultimately it may require fusion or joint implantation but not anticipating out and she read then signed consent form and patient will have done in the next 2 weeks.  I dispensed air fracture walker with all instructions on usage and patient can wear it prior to procedure to get used to it and feel comfortable and it and all other questions are answered currently with total recovery period to take 4 to 6 months  X-rays dated today indicated no apparent progression of deformity except for possible slight increase in spur size to be expected

## 2024-08-15 ENCOUNTER — Telehealth: Payer: Self-pay | Admitting: Podiatry

## 2024-08-15 NOTE — Telephone Encounter (Signed)
 DOS- 08/21/2024  [name] BUNIONECTOMY RT- 71703  [employer] EFFECTIVE DATE- 02/10/2024  DEDUCTIBLE- $3500 REMAINING- $0 OOP- $8850 REMAINING- $3493.61 COINSURANCE- 30%  PER CARELON PORTAL, PRIOR AUTH FOR CPT CODE 71703 HAS BEEN APPROVED FROM 08/21/2024-10/19/2024. AUTH# [id number]

## 2024-08-27 ENCOUNTER — Encounter: Admitting: Podiatry

## 2024-09-10 ENCOUNTER — Encounter: Admitting: Podiatry

## 2024-10-25 ENCOUNTER — Ambulatory Visit: Admitting: Podiatry
